# Patient Record
Sex: Female | Born: 1980 | Race: White | Hispanic: No | Marital: Married
Health system: Midwestern US, Community
[De-identification: ages and names within clinical notes are randomized; demographics above are authoritative.]

## PROBLEM LIST (undated history)

## (undated) DIAGNOSIS — Z0000 Encounter for general adult medical examination without abnormal findings: Secondary | ICD-10-CM

## (undated) DIAGNOSIS — F419 Anxiety disorder, unspecified: Secondary | ICD-10-CM

## (undated) DIAGNOSIS — E538 Deficiency of other specified B group vitamins: Secondary | ICD-10-CM

## (undated) DIAGNOSIS — R7301 Impaired fasting glucose: Secondary | ICD-10-CM

## (undated) DIAGNOSIS — E559 Vitamin D deficiency, unspecified: Secondary | ICD-10-CM

## (undated) DIAGNOSIS — F3341 Major depressive disorder, recurrent, in partial remission: Secondary | ICD-10-CM

## (undated) DIAGNOSIS — F341 Dysthymic disorder: Secondary | ICD-10-CM

## (undated) DIAGNOSIS — R590 Localized enlarged lymph nodes: Secondary | ICD-10-CM

## (undated) MED ORDER — DULOXETINE 30 MG CAP, DELAYED RELEASE
30 mg | ORAL_CAPSULE | ORAL | Status: DC
Start: ? — End: 2013-07-28

---

## 2011-01-20 LAB — BLOOD TYPE, (ABO+RH)
ABO/Rh(D): A POS
ABO/Rh: A POS

## 2013-01-07 LAB — HM PAP SMEAR

## 2013-05-01 NOTE — Progress Notes (Signed)
MARRIED - STARTING A FAMILY.  HAD VARICELLA AS A CHILD - BUT NO EVIDENCE OF IMMUNITY NOW.  GYN RECOMMENDING VACCINATION - SHE INTENDS TO HAVE THE SERIES OF TWO.    VARICELLA VACCINE #1 GIVEN TODAY.

## 2013-07-07 NOTE — Patient Instructions (Signed)
Chickenpox Vaccine: What You Need to Know    Many Vaccine Information Statements are available in Spanish and other languages. See AbsolutelyGenuine.com.br.    1. Why get vaccinated?    Chickenpox (also called varicella) is a common childhood disease. It is usually mild, but it can be serious, especially in young infants and adults.    ??? It causes a rash, itching, fever, and tiredness.  ??? It can lead to severe skin infection, scars, pneumonia, brain damage, or death.  ??? The chickenpox virus can be spread from person to person through the air, or by contact with fluid from chickenpox blisters.  ??? A person who has had chickenpox can get a painful rash called shingles years later.  ??? Before the vaccine, about 11,000 people were hospitalized for chickenpox each year in the [place].  ??? Before the vaccine, about 100 people died each year as a result of chickenpox in the [place].    Chickenpox vaccine can prevent chickenpox.    Most people who get chickenpox vaccine will not get chickenpox.  But if someone who has been vaccinated does get chickenpox, it is usually very mild.  They will have fewer blisters, are less likely to have a fever, and will recover faster.      2. Who should get chickenpox vaccine and when?    Routine  Children who have never had chickenpox should get 2doses of chickenpox vaccine at these ages:    1st Dose: 44-33 months of age  37nd Dose: 63-108 years of age (may be given earlier, if at least 3 months after the 1st dose)    People 30 years of age and older (who have never had chickenpox or received chickenpox vaccine) should get two doses at least 28 days apart.    Catch-Up  Anyone who is not fully vaccinated, and never had chickenpox, should receive one or two doses of chickenpox vaccine.  The timing of these doses depends on the person???s age.  Ask your provider.      Chickenpox vaccine may be given at the same time as other vaccines.    Note: A ???combination??? vaccine called MMRV, which  contains both chickenpox and MMR and vaccines, may be given instead of the two individual vaccines to people 95 years of age and younger.  3. Some people should not get chickenpox vaccine or should wait.    ??? People should not get chickenpox vaccine if they have ever had a life-threatening allergic reaction to a previous dose of chickenpox vaccine or to gelatin or the antibiotic neomycin.    ??? People who are moderately or severely ill at the time the shot is scheduled should usually wait until they recover before getting chickenpox vaccine.    ??? Pregnant women should wait to get chickenpox vaccine until after they have given birth. Women should not get pregnant for 1 month after getting chickenpox vaccine.     ??? Some people should check with their doctor about whether they should get chickenpox vaccine, including anyone who:   -  Has HIV/AIDS or another disease that affects the immune system   -  Is being treated with drugs that affect the immune system, such as steroids, for 2 weeks or longer   - Has any kind of cancer   - Is getting cancer treatment with radiation or drugs    ??? People who recently had a transfusion or were given other blood products should ask their doctor when they may  get chickenpox vaccine.    Ask your provider for more information.      4. What are the risks from chickenpox vaccine?    A vaccine, like any medicine, is capable of causing serious problems, such as severe allergic reactions. The risk of chickenpox vaccine causing serious harm, or death, is extremely small.    Getting chickenpox vaccine is much safer than getting chickenpox disease.  Most people who get chickenpox vaccine do not have any problems with it. Reactions are usually more likely after the first dose than after the second.     Mild Problems  ??? Soreness or swelling where the shot was given (about 1 out of 5 children and up to 1 out of 3 adolescents and adults)  ??? Fever (1 person out of 10, or less)  ??? Mild rash, up to a  month after vaccination (1 person out of 25). It is possible for these people to infect other members of their household, but this is extremely rare.    Moderate Problems  ??? Seizure (jerking or staring) caused by fever (very rare).    Severe Problems  ??? Pneumonia (very rare)  Other serious problems, including severe brain reactions and low blood count, have been reported after chickenpox vaccination. These happen so rarely experts cannot tell whether they are caused by the vaccine or not. If they are, it is extremely rare.    Note: The first dose of MMRV vaccine has been associated with rash and higher rates of fever than MMR and varicella vaccines given separately. Rash has been reported in about 1 person in 20 and fever in about 1 person in 5.   Seizures caused by a fever are also reported more often after MMRV. These usually occur 5-12 days after the first dose.       5. What if there is a moderate or severe reaction?    What should I look for?  Any unusual condition, such as a high fever, weakness,  or behavior changes. Signs of a severe allergic reaction can include difficulty breathing, hoarseness or wheezing, hives, paleness, weakness, a fast heart beat or dizziness.    What should I do?   ??? Call a doctor, or get the person to a doctor right away.   ??? Tell the doctor what happened, the date and time it happened, and when the vaccination was given.   ??? Ask your provider to report the reaction by filing a Vaccine Adverse Event Reporting System  VAERS) form. Or you can file this report through the VAERS website at www.vaers.SamedayNews.es, or by calling (305)880-4356.     VAERS does not provide medical advice.      6. The National Vaccine Injury Compensation Program    A federal program has been created to help people who may have been harmed by a vaccine.     For details about the National Vaccine Injury Compensation Program, call 626 695 8144 or visit their website at GoldCloset.com.ee.      7. How  can I learn more?    ??? Ask your provider. They can give you the vaccine package insert or suggest other sources of    information.  ??? Call your local or state health department.  ??? Contact the Centers for Disease Control and Prevention (CDC):   - Call 609-211-9722 (1-800-CDC-INFO)   - Visit CDC???s website at http://hunter.com/    Vaccine Information Statement (Interim)  Varicella Vaccine  (11/22/06)   42 U[place] ?? 829HB-71  Department of Health and [employer] for Disease Control and Prevention

## 2013-07-07 NOTE — Progress Notes (Signed)
Jennifer Campos is a 32 y.o. female who presents for routine immunizations.   She denies any symptoms , reactions or allergies that would exclude them from being immunized today.  Risks and adverse reactions were discussed and the VIS was given to them. All questions were addressed.  She was observed for 10 min post injection. There were no reactions observed.  She was given 2nd dose of varicella vaccination today.    Pt also mentions that she stopped taking her cymbalta in Sept 2014.  Pt states that she feels better and that with diet and exercise she lost approx 15lbs.  I explained to the pt the risks of stopping cymbalta abruptly without tapering (i.e. Seizures, depression, etc).  Pt agrees to call the office next time if she has questions regarding discontinuation of medications.    Jennifer Durand, MD

## 2013-07-28 NOTE — Progress Notes (Signed)
Chief Complaint:  32 y.o. WHITE OR CAUCASIAN female presents for medication managemnet  HPI:  Discontinued Cymbalta 05/29/13 after weaning herself off by taking every 2nd day, then every 3rd.... Feels good since she started running and doing yoga, and feels she no longer needs xanax or Cymbalta. Her Yoga helps her deal with anxiety, and no longer has pain so she is not taking Motrin.   Not yet trying to get pregnant , but not using birth control. Has gyn appt w/ Dr Kathi Ludwig next week.     ROS:  A comprehensive review of systems was negative except for: as above.       Allergies   Allergen Reactions   ??? Macrobid [Nitrofurantoin Monohyd/M-Cryst] Shortness of Breath     Current Outpatient Prescriptions   Medication Sig Dispense Refill   ??? PRENAT VIT COMB.10/IRON/FA/DHA (CAVAN-FOLATE DHA PO) Take  by mouth.       ??? cholecalciferol, vitamin D3, (VITAMIN D3) 2,000 unit tab Take  by mouth.       ??? omega-3 fatty acids-vitamin e (FISH OIL) 1,000 mg cap Take 1 Cap by mouth.       ??? cyanocobalamin 1,000 mcg tablet Take 1,000 mcg by mouth daily.         Past Medical History   Diagnosis Date   ??? Multiple thyroid nodules 01/14/2013     Stable on sono 11/09/11   ??? Chronic pain 01/14/2013     R rib, T11 area   ??? Seasonal allergic rhinitis 01/14/2013   ??? B12 deficiency 01/14/2013   ??? Vitamin D deficiency 01/14/2013     History reviewed. No pertinent past surgical history.  History reviewed. No pertinent family history.  History     Social History   ??? Marital Status: MARRIED     Spouse Name: fiance Amoy Steeves     Number of Children: 0   ??? 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 Smoking status: Never Smoker    ??? Smokeless tobacco: Never Used   ??? Alcohol Use: Yes      Comment: 1 drink/week   ??? Drug Use: No   ??? Sexually Active: Yes -- Female partner(s)     Other Topics Concern   ??? Not on file     Social History Narrative   ??? No narrative on file           BP 100/70   Pulse 66   Ht 5'  5" (1.651 m)   Wt 145 lb (65.772 kg)   BMI 24.13 kg/m2   SpO2 98%    PHYSICAL EXAM:  General appearance  [x] WDWN  [x] NAD      Eyes  [x] Perrl  [x] EOMI  [x] Conjunct and lids     ENT  [x] Ext NL  [] Oral Cav NL  [x] No hearing loss  [] Intern NL  [x] Lips teeth gums NL   Neck  [x] NL Appearance  [x] Thyroid NL      Respiratory  [x] BS WNL  [] Chest percussion WNL  [x] NL resp excursion  [] Chest palpation WNL    CVS  [x] RRR  [] No Bruits  [] No edema/varicos  [] No murmur  [] Fem/Carotid pulse WNL     [] 2+Bilat DP and PT pulses  [] NL palpation      Chest/Breast  [] Gen apppear WNL  [] No masses or discharge      GI  [] No HSM  [] No rectal mass  [  x]Soft, Non-tender  [] No hernia    GU-Female  [] NL ext genitalia  [] Prostate WNL  [] As per Uro     GU- Female  [] NL ext genitalia and vagina  [] External exam NL  [x] as per GYN     Lymph  [x] Neg neck nodes   [] Neg groin nodes  [x] Neg supraclav nodes  [] Neg axillae nodes    Skin  [x] No rash, lesions, or ulcers  [x] Warm and dry      Neuro  [x] Reflexes WNL  [x] Sensory WNL   [x] CN 2-12 WNL     Psych  [x] Alert  [x] Oriented x 3  [x] Memory intact     Mus  [x]  Power/strength 5/5 bilat  [x] Joints WNL  [x] No scoliosis  [x] Gait WNL  [x] No spinal tenderness     [x] No rigidity tremor  [x] No kyphosis  [x] No C/C/E        Results for orders placed in visit on 01/14/13   [facility] PAP SMEAR       Result Value Range    Amb. Pap Smear [facility] - Dr Kathi Ludwig         Health Maintenance   Topic Date Due   ??? Influenza Age 60 To Adult  04/11/2014   ??? Pap Aka Cervical Cytology  01/08/2016   ??? Td Q 10 Yrs Age > 18  01/15/2023   ??? Tdap Age > 18  Completed       Assessment/Plan:  Jennifer Campos was seen today for medication evaluation.    Diagnoses and associated orders for this visit:    Need for prophylactic vaccination and inoculation against influenza  - INFLUENZA VIRUS VACCINE, SPLIT, PRES. FREE, IN INDIVIDS. >=3 YRS OF AGE, IM          Medications Discontinued During This Encounter   Medication Reason   ??? tretinoin (RETIN-A) 0.01 % topical gel  Not A Current Medication   ??? ibuprofen (MOTRIN) 800 mg tablet Not A Current Medication   ??? GLYCOLIC ACID Not A Current Medication   ??? ALPRAZolam (XANAX) 0.25 mg tablet Other   ??? duloxetine (CYMBALTA) 30 mg capsule Other             Total time spent with patient:  30 minutes   [x]   >50% of visit spent in counseling and coordination of care on above issues     Care Plan discussed with:   [x] Patient  [] Family  [] Care Manager  [] Nursing  [] Consultant/Specialist              Discussed:   [] Advanced Directives   [x]  diet  [x] excercise   [] seat belts    []  smoking cessation  [] safe sex practices   [] sun screen usage  [] weight loss recommended    [] referred to diabetes educator   [] carbohydrate counting reviewed   [] blood sugar goals reviewed  [] complications of diabetes reviewed    []   []          Jennifer Faulstich, MD   __________________________________________________

## 2013-07-28 NOTE — Patient Instructions (Addendum)
Vaccine Information Statement    Influenza (Flu) Vaccine (Inactivated or Recombinant): What you need to know  2014-15    Many Vaccine Information Statements are available in Spanish and other languages. See www.immunize.org/vis  Hojas de Informaci??n Sobre Vacunas est??n disponibles en Espa??ol y en muchos otros idiomas. Visite http://www.immunize.org/vis    1. Why get vaccinated?    Influenza (???flu???) is a contagious disease that spreads around the [place] every winter, usually between October and May.     Flu is caused by influenza viruses, and is spread mainly by coughing, sneezing, and close contact.     Anyone can get flu, but the risk of getting flu is highest among children. Symptoms come on suddenly and may last several days. They can include:  ??? fever/chills  ??? sore throat  ??? muscle aches  ??? fatigue  ??? cough  ??? headache   ??? runny or stuffy nose    Flu can make some people much sicker than others. These people include young children, people 65 and older, pregnant women, and people with certain health conditions ??? such as heart, lung or kidney disease, nervous system disorders, or a weakened immune system.  Flu vaccination is especially important for these people, and anyone in close contact with them.    Flu can also lead to pneumonia, and make existing medical conditions worse. It can cause diarrhea and seizures in children.     Each year thousands of people in the [place] die from flu, and many more are hospitalized.     Flu vaccine is the best protection against flu and its complications. Flu vaccine also helps prevent spreading flu from person to person.       2. Inactivated and recombinant flu vaccines    You are getting an injectable flu vaccine, which is either an ???inactivated??? or ???recombinant??? vaccine. These vaccines do not contain any live influenza virus.  They are given by injection with a needle, and often called the ???flu shot.???    A different, live, attenuated (weakened) influenza  vaccine is sprayed into the nostrils. This vaccine is described in a separate Vaccine Information Statement.    Flu vaccination is recommended every year. Some children 6 months through 8 years of age might need two doses during one year.     Flu viruses are always changing. Each year???s flu vaccine is made to protect against 3 or 4 viruses that are likely to cause disease that year. Flu vaccine cannot prevent all cases of flu, but it is the best defense against the disease.     It takes about 2 weeks for protection to develop after the vaccination, and protection lasts several months to a year.     Some illnesses that are not caused by influenza virus are often mistaken for flu. Flu vaccine will not prevent these illnesses. It can only prevent influenza.    Some inactivated flu vaccine contains a very small amount of a mercury-based preservative called thimerosal. Studies have shown that thimerosal in vaccines is not harmful, but flu vaccines that do not contain a preservative are available.      3. Some people should not get this vaccine    Tell the person who gives you the vaccine:  ??? If you have any severe, life-threatening allergies.  If you ever had a life-threatening allergic reaction after a dose of flu vaccine, or have a severe allergy to any part of this vaccine, including (for example) an allergy to gelatin,   antibiotics, or eggs, you may be advised not to get vaccinated.  Most, but not all, types of flu vaccine contain a small amount of egg protein.     ??? If you ever had Guillain-Barr?? Syndrome (a severe paralyzing illness, also called GBS). Some people with a history of GBS should not get this vaccine. This should be discussed with your doctor.  ??? If you are not feeling well.  It is usually okay to get flu vaccine when you have a mild illness, but you might be advised to wait until you feel better.  You should come back when you are better.      4. Risks of a vaccine reaction    With a vaccine, like any  medicine, there is a chance of side effects. These are usually mild and go away on their own.             Problems that could happen after any vaccine:   ??? Brief fainting spells can happen after any medical procedure, including vaccination. Sitting or lying down for about 15 minutes can help prevent fainting, and injuries caused by a fall. Tell your doctor if you feel dizzy, or have vision changes or ringing in the ears.   ??? Severe shoulder pain and reduced range of motion in the arm where a shot was given can happen, very rarely, after a vaccination.   ??? Severe allergic reactions from a vaccine are very rare, estimated at less than 1 in a million doses. If one were to occur, it would usually be within a few minutes to a few hours after the vaccination.     Mild problems following inactivated flu vaccine:   ??? soreness, redness, or swelling where the shot was given    ??? hoarseness  ??? sore, red or itchy eyes  ??? cough  ??? fever  ??? aches  ??? headache  ??? itching  ??? fatigue  If these problems occur, they usually begin soon after the shot and last 1 or 2 days.     Moderate problems following inactivated flu vaccine:  ??? Young children who get inactivated flu vaccine and pneumococcal vaccine (PCV13) at the same time may be at increased risk for seizures caused by fever. Ask your doctor for more information. Tell your doctor if a child who is getting flu vaccine has ever had a seizure.    Inactivated flu vaccine does not contain live flu virus, so you cannot get the flu from this vaccine.     As with any medicine, there is a very remote chance of a vaccine causing a serious injury or death.    The safety of vaccines is always being monitored. For more information, visit: www.cdc.gov/vaccinesafety/    5. What if there is a serious reaction?    What should I look for?  ??? Look for anything that concerns you, such as signs of a severe allergic reaction, very high  fever, or behavior changes.     Signs of a severe allergic reaction  can include hives, swelling of the face and throat, difficulty   breathing, a fast heartbeat, dizziness, and weakness. These would usually start a few minutes to a few hours after the vaccination.    What should I do?  ??? If you think it is a severe allergic reaction or other emergency that can???t wait, call 9-1-1 and get the person to the nearest hospital. Otherwise, call your doctor.    ??? Afterward, the reaction should   be reported to the ???Vaccine Adverse Event Reporting    System??? (VAERS). Your doctor should file this report, or you can do it yourself through    the VAERS web site at www.vaers.LAgents.no, or by calling 1-5751705233.    VAERS does not give medical advice.      6. The National Vaccine Injury Compensation Program    The [employer] Vaccine Injury Compensation Program (VICP) is a federal program that was created to compensate people who may have been injured by certain vaccines.    Persons who believe they may have been injured by a vaccine can learn about the program and about filing a claim by calling 1-(305)782-5996 or visiting the VICP website at SpiritualWord.at.  There is a time limit to file a claim for compensation.    7. How can I learn more?  ??? Ask your health care provider.  ??? Call your local or state health department.  ??? Contact the Centers for Disease Control and   Prevention (CDC):  - Call 316-542-3301 (1-800-CDC-INFO) or  - Visit CDC???s website at BiotechRoom.com.cy      Vaccine Information Statement (Interim)  Inactivated Influenza Vaccine   04/29/2013  42 [place] ?? [id number]-[id number]    Department of Health and Insurance risk surveyor for Disease Control and Prevention    Office Use Only    Influenza (Flu) Vaccine: After Your Visit  Your Care Instructions  Influenza (flu) is an infection in the lungs and breathing passages. It is caused by the influenza virus. There are different strains, or types, of the flu virus every year. The flu comes on quickly. It can cause a cough, stuffy nose,  fever, chills, tiredness, and aches and pains. These symptoms may last up to 10 days. The flu can make you feel very sick, but most of the time it doesn't lead to other problems. But it can cause serious problems in people who are older or who have a long-term illness, such as heart disease or diabetes.  You can help prevent the flu by getting a flu vaccine every year, as soon as it is available. It is given as a shot or in a nasal spray. The viruses in the flu shot are dead, and the nasal spray (FluMist) has weakened live viruses. You cannot get the flu from the shot or the spray. FluMist can be given to healthy people from ages 2 through 37. FluMist is not approved for pregnant women. The vaccine prevents most cases of the flu. But even when the vaccine doesn't prevent the flu, it can make symptoms less severe and reduce the chance of problems from the flu.  Sometimes, young children and people who have an immune system problem may have a slight fever or muscle aches or pains 6 to 12 hours after getting the shot. These symptoms usually last 1 or 2 days.  Follow-up care is a key part of your treatment and safety. Be sure to make and go to all appointments, and call your doctor if you are having problems. It's also a good idea to know your test results and keep a list of the medicines you take.  Who should get the flu vaccine?  Everyone age 86 months or older should get a flu vaccine each year. It lowers the chance of getting and spreading the flu. The vaccine is very important for people who are at high risk for getting other health problems from the flu. This includes:  ?? Anyone 33 years of age or  older.  ?? People who live in a long-term care center, such as a nursing home.  ?? All children 6 months through 34 years of age.  ?? Adults and children 6 months and older who have long-term heart or lung problems, such as asthma.  ?? Adults and children 6 months and older who needed medical care or were in a hospital during  the past year because of diabetes, chronic kidney disease, or a weak immune system (including HIV or AIDS).  ?? Women who will be pregnant during the flu season.  ?? People who have any condition that can make it hard to breathe or swallow (such as a brain injury or muscle disorders).  ?? People who can give the flu to others who are at high risk for problems from the flu. This includes all health care workers and close contacts of people age 34 or older.  Who should not get the flu vaccine?  The person who gives the vaccine may tell you not to get it if you:  ?? Have a severe allergy to eggs or any part of the vaccine.  ?? Have had a severe reaction to a flu vaccine in the past.  ?? Have had Guillain-Barr?? syndrome (GBS).  ?? Are sick with a fever. (Get the vaccine when symptoms are gone.)  How can you care for yourself at home?  ?? If you or your child has a sore arm or a slight fever after the shot, take an over-the-counter pain medicine, such as acetaminophen (Tylenol) or ibuprofen (Advil, Motrin). Read and follow all instructions on the label. Do not give aspirin to anyone younger than 20. It has been linked to Reye syndrome, a serious illness.  ?? Do not take two or more pain medicines at the same time unless the doctor told you to. Many pain medicines have acetaminophen, which is Tylenol. Too much acetaminophen (Tylenol) can be harmful.  When should you call for help?  Call 911 anytime you think you may need emergency care. For example, call if after getting the flu vaccine:  ?? You have symptoms of a severe reaction to the flu vaccine. Symptoms of a severe reaction may include:  ?? Severe difficulty breathing.  ?? Sudden raised, red areas (hives) all over your body.  ?? Severe lightheadedness.  Call your doctor now or seek immediate medical care if after getting the flu vaccine:  ?? You think you are having a reaction to the flu vaccine, such as a new fever.  Watch closely for changes in your health, and be sure to  contact your doctor if you have any problems.   Where can you learn more?   Go to [email or web address]  Enter N880 in the search box to learn more about "Influenza (Flu) Vaccine: After Your Visit."   ?? 2006-2014 Healthwise[employer] Inc[employer]. Care instructions adapted under license by [employer] (which disclaims liability or warranty for this information). This care instruction is for use with your licensed healthcare professional. If you have questions about a medical condition or this instruction, always ask your healthcare professional. Healthwise, Incorporated disclaims any warranty or liability for your use of this information.  Content Version: 10.2.346038; Current as of: November 20, 2012

## 2013-07-30 ENCOUNTER — Encounter

## 2013-11-18 ENCOUNTER — Encounter

## 2014-01-12 LAB — AMB POC RAPID STREP A: Group A Strep Ag: NEGATIVE

## 2014-01-12 MED ORDER — BENZONATATE 200 MG CAP
200 mg | ORAL_CAPSULE | Freq: Three times a day (TID) | ORAL | Status: AC | PRN
Start: 2014-01-12 — End: 2014-01-19

## 2014-01-12 MED ORDER — PREDNISONE 10 MG TAB
10 mg | ORAL_TABLET | ORAL | Status: DC
Start: 2014-01-12 — End: 2014-01-30

## 2014-01-12 MED ORDER — LORATADINE 10 MG TAB
10 mg | ORAL_TABLET | Freq: Every day | ORAL | Status: AC
Start: 2014-01-12 — End: ?

## 2014-01-12 NOTE — Progress Notes (Signed)
Chief Complaint:  33 y.o. WHITE OR CAUCASIAN female cough, nasal and head congestion, sore throat  HPI:  Had flu vaccine 07/28/13.  Got sik in December - nasal and head congestion, sore throat - took nose spray, nasal decongestant. Went to clinic at work and treated with Augmentin. Reports she had a persistent deep cough. At the end of February again got sick, pain on swallowing, head congestion, laryngitis - Again treated w/ Augmentin, phenylepherine tabs, robitussin. Throat felt better and nasal congestion improved, but cough again persisted. On 12/31/13 again developed sore throat - went to clinic - not given any antibiotics. Reports nasal congestion again, cough productive green sputum, sore throat, pain on swallowing. Has pain in ears when flying.    ROS: Denies CP, SOB, abdominal pain, dysuria, HA, fever, chills, rash  A comprehensive review of systems was negative except as above      Allergies   Allergen Reactions   ??? Macrobid [Nitrofurantoin Monohyd/M-Cryst] Shortness of Breath     Current Outpatient Prescriptions   Medication Sig Dispense Refill   ??? krill-om-3-dha-epa-phospho-ast 1,000-170-80-50 mg cap Take  by mouth.       ??? glucosamine (GLUCOSAMINE RELIEF) 1,000 mg tab Take  by mouth.       ??? cyanocobalamin (VITAMIN B-12) 500 mcg tablet Take 500 mcg by mouth daily.       ??? predniSONE (DELTASONE) 10 mg tablet 6 tablets today, then 5 tab x 1 day, 4 tab x 1 day, 3 tab x 1 day, 2 tab x 1 day, 1 tab x 1 day  21 Tab  0   ??? benzonatate (TESSALON) 200 mg capsule Take 1 Cap by mouth three (3) times daily as needed for Cough for up to 7 days.  30 Cap  0   ??? loratadine (CLARITIN) 10 mg tablet Take 1 Tab by mouth daily.  30 Tab  0   ??? PRENAT VIT COMB.10/IRON/FA/DHA (CAVAN-FOLATE DHA PO) Take  by mouth.       ??? cholecalciferol, vitamin D3, (VITAMIN D3) 2,000 unit tab Take  by mouth.       ??? omega-3 fatty acids-vitamin e (FISH OIL) 1,000 mg cap Take 1 Cap by mouth.         Past Medical History   Diagnosis Date   ???  Multiple thyroid nodules 01/14/2013     Stable on sono 11/09/11   ??? Chronic pain 01/14/2013     R rib, T11 area   ??? Seasonal allergic rhinitis 01/14/2013   ??? B12 deficiency 01/14/2013   ??? Vitamin D deficiency 01/14/2013     History reviewed. No pertinent past surgical history.  History reviewed. No pertinent family history.  History     Social History   ??? Marital Status: MARRIED     Spouse Name: fiance Jennifer Campos     Number of Children: 0   ??? 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 Smoking status: Never Smoker    ??? Smokeless tobacco: Never Used   ??? Alcohol Use: Yes      Comment: 1 drink/week   ??? Drug Use: No   ??? Sexual Activity:     Partners: Male     Other Topics Concern   ??? Not on file     Social History Narrative       No flowsheet data found.  No flowsheet data found.  BP 95/70   Pulse 77   Temp(Src) 98.9 ??F (37.2 ??C) (Oral)   Ht 5\' 5"  (1.651 m)   Wt 148 lb (67.132 kg)   BMI 24.63 kg/m2   SpO2 99%    PHYSICAL EXAM:  General appearance  [x] WDWN  [x] NAD B/l nasal congestion No posterior pharyngeal erythema    Eyes  [] Perrl  [x] EOMI  [x] Conjunct and lids     ENT  [x] Ext NL  [] Oral Cav NL  [] No hearing loss  [] Intern NL  [x] Lips teeth gums NL   Neck  [x] NL Appearance  [x] Thyroid NL      Respiratory  [x] BS WNL  [] Chest percussion WNL  [x] NL resp excursion  [] Chest palpation WNL    CVS  [x] RRR  [] No Bruits  [] No edema/varicos  [x] No murmur  [] Fem/Carotid pulse WNL     [] 2+Bilat DP and PT pulses  [] NL palpation      Chest/Breast  [] Gen apppear WNL  [] No masses or discharge      GI  [] No HSM  [] No rectal mass  [] Soft, Non-tender  [] No hernia    GU-Female  [] NL ext genitalia  [] Prostate WNL  [] As per Uro     GU- Female  [] NL ext genitalia and vagina  [] External exam NL  [x] as per GYN     Lymph  [x] Neg neck nodes   [] Neg groin nodes  [x] Neg supraclav nodes  [] Neg axillae nodes    Skin  [x] No rash, lesions, or ulcers  [x] Warm and dry      Neuro  [x] Reflexes WNL   [] Sensory WNL   [] CN 2-12 WNL     Psych  [x] Alert  [x] Oriented x 3  [x] Memory intact     Mus  [x]  Power/strength 5/5 bilat  [x] Joints WNL  [x] No scoliosis  [x] Gait WNL  [] No spinal tenderness     [x] No rigidity tremor  [x] No kyphosis  [x] No C/C/E        Results for orders placed in visit on 01/12/14   AMB POC RAPID STREP A       Result Value Ref Range    VALID INTERNAL CONTROL POC No      Group A Strep Ag Negative  Negative       Results for orders placed in visit on 01/12/14   AMB POC RAPID STREP A       Result Value Ref Range    VALID INTERNAL CONTROL POC No      Group A Strep Ag Negative  Negative       Health Maintenance   Topic Date Due   ??? INFLUENZA AGE 61 TO ADULT  04/11/2014   ??? PAP AKA CERVICAL CYTOLOGY  01/08/2016   ??? Td Q 10 Yrs Age > 18  01/15/2023   ??? TDAP AGE > 18  Completed       Assessment/Plan:  Jennifer Campos was seen today for cough, nasal congestion and sore throat.    Diagnoses and associated orders for this visit:    Cough  - predniSONE (DELTASONE) 10 mg tablet; 6 tablets today, then 5 tab x 1 day, 4 tab x 1 day, 3 tab x 1 day, 2 tab x 1 day, 1 tab x 1 day  - benzonatate (TESSALON) 200 mg capsule; Take 1 Cap by mouth three (3) times daily as needed for Cough for up to 7 days.  - loratadine (CLARITIN) 10 mg tablet; Take 1 Tab by mouth daily.    Sore throat  - AMB POC RAPID STREP  A    Nasal congestion  - predniSONE (DELTASONE) 10 mg tablet; 6 tablets today, then 5 tab x 1 day, 4 tab x 1 day, 3 tab x 1 day, 2 tab x 1 day, 1 tab x 1 day  - loratadine (CLARITIN) 10 mg tablet; Take 1 Tab by mouth daily.    Post-nasal drip  - predniSONE (DELTASONE) 10 mg tablet; 6 tablets today, then 5 tab x 1 day, 4 tab x 1 day, 3 tab x 1 day, 2 tab x 1 day, 1 tab x 1 day  - loratadine (CLARITIN) 10 mg tablet; Take 1 Tab by mouth daily.          Medications Discontinued During This Encounter   Medication Reason   ??? cyanocobalamin 1,000 mcg tablet Not A Current Medication       Total time spent with patient:  30 minutes   []    >50% of visit spent in counseling and coordination of care on above issues     Care Plan discussed with:   [x] Patient  [] Family  [] Care Manager  [] Nursing  [] Consultant/Specialist              Discussed:   [] Advanced Directives   []  diet  [] excercise   [] seat belts    []  smoking cessation  [] safe sex practices   [] sun screen usage  [] weight loss recommended    [] referred to diabetes educator   [] carbohydrate counting reviewed   [] blood sugar goals reviewed  [] complications of diabetes reviewed    []   []          Dolly Harbach, MD   __________________________________________________

## 2014-01-12 NOTE — Patient Instructions (Signed)
Managing Your Allergies: After Your Visit  Your Care Instructions  Managing your allergies is an important part of staying healthy. Your doctor will help you find out what may be causing the allergies. Common causes of allergy symptoms are house dust and dust mites, animal dander, mold, and pollen.  As soon as you know what triggers your symptoms, try to reduce your exposure to your triggers. This can help prevent allergy symptoms, asthma, and other health problems.  Ask your doctor about allergy medicine or immunotherapy. These treatments may help reduce or prevent allergy symptoms.  Follow-up care is a key part of your treatment and safety. Be sure to make and go to all appointments, and call your doctor if you are having problems. It's also a good idea to know your test results and keep a list of the medicines you take.  How can you care for yourself at home?  ?? If you think that dust or dust mites are causing your allergies:  ?? Wash sheets, pillowcases, and other bedding every week in hot water.  ?? Use airtight, dust-proof covers for pillows, duvets, and mattresses. Avoid plastic covers, because they tend to tear quickly and do not "breathe." Wash according to the instructions.  ?? Remove extra blankets and pillows that you don't need.  ?? Use blankets that are machine-washable.  ?? Don't use home humidifiers. They can help mites live longer.  ?? Use air-conditioning. Change or clean all filters every month. Keep windows closed. Use high-efficiency air filters. Don't use window or attic fans, which draw dust into the air.  ?? If you're allergic to pet dander, keep pets outside or, at the very least, out of your bedroom. Old carpet and cloth-covered furniture can hold a lot of animal dander. You may need to replace them.  ?? Look for signs of cockroaches. Use cockroach baits to get rid of them. Then clean your home well.  ?? If you're allergic to mold, don't keep indoor plants, because molds can grow in soil. Get rid  of furniture, rugs, and drapes that smell musty. Check for mold in the bathroom.  ?? If you're allergic to pollen, stay inside when pollen counts are high.  ?? Don't smoke or let anyone else smoke in your house. Don't use fireplaces or wood-burning stoves. Avoid paint fumes, perfumes, and other strong odors.  When should you call for help?  Call 911 anytime you think you may need emergency care. For example, call if:  ?? You have symptoms of a severe allergic reaction. These may include:  ?? Sudden raised, red areas (hives) all over your body.  ?? Swelling of the throat, mouth, lips, or tongue.  ?? Trouble breathing.  ?? Passing out (losing consciousness). Or you may feel very lightheaded or suddenly feel weak, confused, or restless.  Watch closely for changes in your health, and be sure to contact your doctor if:  ?? Your allergies get worse.  ?? You need help controlling your allergies.  ?? You have questions about allergy testing.  ?? You do not get better as expected.   Where can you learn more?   Go to http://www.healthwise.net/[facility]  Enter L249 in the search box to learn more about "Managing Your Allergies: After Your Visit."   ?? 2006-2015 Healthwise, Incorporated. Care instructions adapted under license by [employer] (which disclaims liability or warranty for this information). This care instruction is for use with your licensed healthcare professional. If you have questions about a medical condition or   this instruction, always ask your healthcare professional. Healthwise, Incorporated disclaims any warranty or liability for your use of this information.  Content Version: 10.4.390249; Current as of: Jan 21, 2013              Sore Throat: After Your Visit  Your Care Instructions     Infection by bacteria or a virus causes most sore throats. Cigarette smoke, dry air, air pollution, allergies, and yelling can also cause a sore throat. Sore throats can be painful and annoying. Fortunately, most sore throats go away on  their own. If you have a bacterial infection, your doctor may prescribe antibiotics.  Follow-up care is a key part of your treatment and safety. Be sure to make and go to all appointments, and call your doctor if you are having problems. It's also a good idea to know your test results and keep a list of the medicines you take.  How can you care for yourself at home?  ?? If your doctor prescribed antibiotics, take them as directed. Do not stop taking them just because you feel better. You need to take the full course of antibiotics.  ?? Gargle with warm salt water once an hour to help reduce swelling and relieve discomfort. Use 1 teaspoon of salt mixed in 1 cup of warm water.  ?? Take an over-the-counter pain medicine, such as acetaminophen (Tylenol), ibuprofen (Advil, Motrin), or naproxen (Aleve). Read and follow all instructions on the label.  ?? Be careful when taking over-the-counter cold or flu medicines and Tylenol at the same time. Many of these medicines have acetaminophen, which is Tylenol. Read the labels to make sure that you are not taking more than the recommended dose. Too much acetaminophen (Tylenol) can be harmful.  ?? Drink plenty of fluids. Fluids may help soothe an irritated throat. Hot fluids, such as tea or soup, may help decrease throat pain.  ?? Use over-the-counter throat lozenges to soothe pain. Regular cough drops or hard candy may also help. These should not be given to young children because of the risk of choking.  ?? Do not smoke or allow others to smoke around you. If you need help quitting, talk to your doctor about stop-smoking programs and medicines. These can increase your chances of quitting for good.  ?? Use a vaporizer or humidifier to add moisture to your bedroom. Follow the directions for cleaning the machine.  When should you call for help?  Call your doctor now or seek immediate medical care if:  ?? You have new or worse trouble swallowing.  ?? Your sore throat gets much worse on one  side.  Watch closely for changes in your health, and be sure to contact your doctor if you do not get better as expected.   Where can you learn more?   Go to [email or web address]://www.healthwise.net/[facility]  Enter U420 in the search box to learn more about "Sore Throat: After Your Visit."   ?? 2006-2015 Healthwise, Incorporated. Care instructions adapted under license by [employer] (which disclaims liability or warranty for this information). This care instruction is for use with your licensed healthcare professional. If you have questions about a medical condition or this instruction, always ask your healthcare professional. Healthwise, Incorporated disclaims any warranty or liability for your use of this information.  Content Version: 10.4.390249; Current as of: July 25, 2013

## 2014-01-22 LAB — VITAMIN B12: Vitamin B12: 836 pg/mL (ref 211–911)

## 2014-01-22 LAB — CBC WITH AUTOMATED DIFF
ABS. BASOPHILS: 0 10*3/uL (ref <0.3)
ABS. EOSINOPHILS: 0.1 10*3/uL (ref <0.7)
ABS. MONOCYTES: 0.3 10*3/uL (ref <1.0)
ABS. NEUTROPHILS: 3.2 10*3/uL (ref 1.6–7.8)
Abs Lymphocytes: 1.7 10*3/uL (ref 1.0–4.5)
BASOPHILS: 0.4 % (ref 0.0–2.0)
EOSINOPHILS: 1.5 % (ref 0.0–7.0)
HCT: 43.1 % (ref 34.0–47.0)
HGB: 14.3 g/dL (ref 11.0–15.5)
Lymphocytes: 31.7 % (ref 15.0–45.0)
MCH: 31.9 pg (ref 25.0–33.0)
MCHC: 33.3 g/dL (ref 30.0–35.0)
MCV: 96 fL (ref 80–102)
MEAN PLATELET VOLUME: 8.9 fL (ref 6.8–13.0)
MONOCYTES: 5.2 % (ref 2.0–10.0)
NEUTROPHILS: 61.1 % (ref 40.0–70.0)
PLATELET: 229 10*3/uL (ref 150–450)
RBC: 4.49 x10E6/uL (ref 3.70–5.20)
RDW-CV: 13.2 % (ref 11.5–15.0)
WBC: 5.3 10*3/uL (ref 4.0–11.0)

## 2014-01-22 LAB — LIPID PANEL
Cholesterol, total: 197 mg/dL (ref 125–200)
HDL Chol (%): 35.7 % (ref >15)
HDL Cholesterol: 70 mg/dL (ref >=46)
LDL, calculated: 114 mg/dL (ref <130)
Non-HDL Cholesterol: 127 mg/dL
T. Chol/HDL Ratio: 2.8 Ratio (ref <5.00)
Triglyceride: 64 mg/dL (ref <150)
VLDL, calculated: 13 (ref 8–35)

## 2014-01-22 LAB — METABOLIC PANEL, COMPREHENSIVE
A-G Ratio: 2 Ratio (ref 0.9–2.4)
ALT (SGPT): 17 IU/L (ref 6–40)
AST (SGOT): 19 IU/L (ref 10–30)
Albumin: 5 g/dL (ref 3.6–5.3)
Alk. phosphatase: 43 IU/L (ref 33–115)
BUN/Creatinine ratio: 20 (ref 5.3–50.0)
BUN: 15 mg/dL (ref 7–25)
Bilirubin, total: 1.7 mg/dL — ABNORMAL HIGH (ref 0.2–1.2)
CO2: 26 mEq/L (ref 21–33)
Calcium: 9.6 mg/dL (ref 8.6–10.4)
Chloride: 101 mEq/L (ref 98–110)
Creatinine: 0.75 mg/dL (ref 0.50–1.30)
Estimated GFR: 88 (ref >60)
Globulin: 2.5 g/dL (ref 1.8–3.6)
Glucose: 121 mg/dL — ABNORMAL HIGH (ref 65–99)
Potassium: 4.6 mmol/L (ref 3.5–5.5)
Protein, total: 7.5 g/dL (ref 6.2–8.3)
Sodium: 136 mEq/L (ref 135–146)
eGFR If African American: 107 (ref >60)

## 2014-01-22 LAB — TSH 3RD GENERATION: TSH: 1.01 u[IU]/mL (ref 0.36–4.42)

## 2014-01-22 LAB — T4 (THYROXINE): T4, Total: 8.3 ug/dL (ref 4.5–12.2)

## 2014-01-22 LAB — T3 TOTAL: T3, total: 80 ng/dL (ref 60–181)

## 2014-01-23 LAB — URINALYSIS W/MICROSCOPIC
Bilirubin: NEGATIVE
Blood: NEGATIVE
Crystals, urine: NONE SEEN /HPF
Glucose: NEGATIVE mg/dL
Ketone: NEGATIVE mg/dL
Nitrites: NEGATIVE
Protein: NEGATIVE mg/dL
RBC Urine: NONE SEEN /HPF (ref ?–5)
Specific gravity: 1.022 R.I. (ref 1.005–1.030)
Urobilinogen: 0.2 mg/dL (ref 0.0–1.0)
Yeast, urine: NONE SEEN /HPF
pH (UA): 6.5 (ref 5.0–8.0)

## 2014-01-23 LAB — VITAMIN D, 25 HYDROXY: VITAMIN D, 25-HYDROXY: 39 ng/mL (ref 30–100)

## 2014-01-30 NOTE — Progress Notes (Signed)
Chief Complaint:  33 y.o. WHITE OR CAUCASIAN female presents for CPE  HPI:  Has been feeling well. Exercising regularly and eating healthy.  ROS: Denies CP, SOB, abdominal pain, dysuria, HA, fever, chills, rash  A comprehensive review of systems was negative except as above      Allergies   Allergen Reactions   ??? Macrobid [Nitrofurantoin Monohyd/M-Cryst] Shortness of Breath     Current Outpatient Prescriptions   Medication Sig Dispense Refill   ??? gluc-chon-msm#1-C-mang-bos-bor (OSTEO BI-FLEX TRIPLE STRENGTH) 750 mg-644 mg- 30 mg-1 mg tab Take  by mouth.       ??? cyanocobalamin (VITAMIN B-12) 500 mcg tablet Take 500 mcg by mouth daily.       ??? loratadine (CLARITIN) 10 mg tablet Take 1 Tab by mouth daily.  30 Tab  0   ??? cholecalciferol, vitamin D3, (VITAMIN D3) 2,000 unit tab Take  by mouth.       ??? omega-3 fatty acids-vitamin e (FISH OIL) 1,000 mg cap Take 1 Cap by mouth.       ??? PRENAT VIT COMB.10/IRON/FA/DHA (CAVAN-FOLATE DHA PO) Take  by mouth.         Past Medical History   Diagnosis Date   ??? Multiple thyroid nodules 01/14/2013     Stable on sono 11/09/11   ??? Chronic pain 01/14/2013     R rib, T11 area   ??? Seasonal allergic rhinitis 01/14/2013   ??? B12 deficiency 01/14/2013   ??? Vitamin D deficiency 01/14/2013     History reviewed. No pertinent past surgical history.  History reviewed. No pertinent family history.  History     Social History   ??? Marital Status: MARRIED     Spouse Name: fiance Angellina Ferdinand     Number of Children: 0   ??? 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 Smoking status: Never Smoker    ??? Smokeless tobacco: Never Used   ??? Alcohol Use: Yes      Comment: 1 drink/week   ??? Drug Use: No   ??? Sexual Activity:     Partners: Male     Other Topics Concern   ??? Not on file     Social History Narrative       PHQ 2 / 9, over the last two weeks 01/30/2014   Little interest or pleasure in doing things Not at all   Feeling down, depressed or hopeless Not at  all   Total Score PHQ 2 0     No flowsheet data found.      BP 90/65   Pulse 78   Temp(Src) 98 ??F (36.7 ??C) (Oral)   Ht 5\' 6"  (1.676 m)   Wt 142 lb (64.411 kg)   BMI 22.93 kg/m2   SpO2 100%    PHYSICAL EXAM:  General appearance  [x] WDWN  [x] NAD      Eyes  [x] Perrl  [x] EOMI  [x] Conjunct and lids     ENT  [x] Ext NL  [x] Oral Cav NL  [] No hearing loss  [] Intern NL  [x] Lips teeth gums NL   Neck  [x] NL Appearance  [x] Thyroid NL      Respiratory  [x] BS WNL  [] Chest percussion WNL  [x] NL resp excursion  [] Chest palpation WNL    CVS  [x] RRR  [] No Bruits  [] No edema/varicos  [x] No murmur  [] Fem/Carotid pulse WNL     [] 2+Bilat DP  and PT pulses  $Remov'[]'cdEdFl$ NL palpation      Chest/Breast  $RemoveBefor'[]'BMEbkTYsqNni$ Gen apppear WNL  $Re'[]'TPD$ No masses or discharge      GI  $R'[x]'kS$ No HSM  $Re'[]'OQi$ No rectal mass  $Rem'[x]'OdzC$ Soft, Non-tender  $RemoveBef'[x]'NzAwYJcCSA$ No hernia    GU-Female  $Remove'[]'CSDtlbn$ NL ext genitalia  $RemoveBe'[]'PaxfznWDU$ Prostate WNL  $Re'[]'NOv$ As per Uro     GU- Female  $Remov'[]'YXcIMD$ NL ext genitalia and vagina  $Remov'[]'vWELza$ External exam NL  $R'[x]'OR$ as per GYN     Lymph  $Remo'[x]'EfQPY$ Neg neck nodes   '[]'$ Neg groin nodes  $Remo'[x]'xJdPb$ Neg supraclav nodes  $Remo'[]'whLtT$ Neg axillae nodes    Skin  $Rem'[x]'Wyoz$ No rash, lesions, or ulcers  $Remov'[x]'qyXkfP$ Warm and dry      Neuro  $Remo'[x]'KUPHh$ Reflexes WNL  $Re'[x]'CIh$ Sensory WNL   '[x]'$ CN 2-12 WNL     Psych  $Remo'[x]'eEUWe$ Alert  $Remo'[x]'xCmPp$ Oriented x 3  $'[x]'b$ Memory intact     Mus  $Re'[x]'yiw$  Power/strength 5/5 bilat  $Remo'[x]'XCHys$ Joints WNL  $Re'[x]'cnj$ No scoliosis  $RemoveBe'[x]'OrnPSLHQM$ Gait WNL  $Re'[x]'LXJ$ No spinal tenderness     '[x]'$ No rigidity tremor  $Remov'[x]'pHBoQb$ No kyphosis  $RemoveB'[x]'opCaflQc$ No C/C/E        Results for orders placed in visit on 51/70/01   METABOLIC PANEL, COMPREHENSIVE       Result Value Ref Range    Glucose 121 (*) 65-99 mg/dL    BUN 15  7-25 mg/dL    Creatinine 0.75  0.50-1.30 mg/dL    eGFR If African American 107  >60    Estimated GFR 88  >60    BUN/Creatinine ratio 20.0  5.3-50.0    Sodium 136  135-146 mEq/L    Potassium 4.6  3.5-5.5 mmol/L    Chloride 101  98-110 mEq/L    CO2 26  21-33 mEq/L    Calcium 9.6  8.6-10.4 mg/dL    Protein, total 7.5  6.2-8.3 g/dL    Albumin 5.0  3.6-5.3 g/dL    Globulin 2.5  1.8-3.6 g/dL    A-G Ratio 2.0  0.9-2.4 Ratio     Alk. phosphatase 43  33-115 IU/L    AST 19  10-30 IU/L    ALT 17  6-40 IU/L    Bilirubin, total 1.7 (*) 0.2-1.2 mg/dL   T4       Result Value Ref Range    T4 8.3  4.5-12.2 ug/dL   TSH, 3RD GENERATION       Result Value Ref Range    TSH 1.01  0.36-4.42 uIU/mL   T3 TOTAL       Result Value Ref Range    T3, total 80  60-181 ng/dL   VITAMIN D, 25 HYDROXY       Result Value Ref Range    VITAMIN D, 25-HYDROXY 39  30-100 ng/mL   VITAMIN B12       Result Value Ref Range    Vitamin B12 836  211-911 pg/mL   CBC WITH AUTOMATED DIFF       Result Value Ref Range    WBC 5.3  4.0-11.0 x10E3/uL    RBC 4.49  3.70-5.20 x10E6/uL    HGB 14.3  11.0-15.5 g/dL    HCT 43.1  34.0-47.0 %    MCV 96  80-102 fL    MCH 31.9  25.0-33.0 pg    MCHC 33.3  30.0-35.0 g/dL    NEUTROPHILS 61.1  40.0-70.0 %    Lymphocytes 31.7  15.0-45.0 %    MONOCYTES 5.2  2.0-10.0 %    EOSINOPHILS 1.5  0.0-7.0 %    BASOPHILS 0.4  0.0-2.0 %    ABS. NEUTROPHILS  3.2  1.6-7.8 x10E3/uL    Abs Lymphocytes 1.7  1.0-4.5 x10E3/uL    ABS. MONOCYTES 0.3  <1.0 x10E3/uL    ABS. EOSINOPHILS 0.1  <0.7 x10E3/uL    ABS. BASOPHILS 0.0  <0.3 x10E3/uL    RDW-CV 13.2  11.5-15.0 %    PLATELET 229  150-450 x10E3/uL    MEAN PLATELET VOLUME 8.9  6.8-13.0 fL   URINALYSIS W/MICROSCOPIC       Result Value Ref Range    Color Yellow  Yellow - Straw    Appearance Clear  Clear - Clear    pH (UA) 6.5  5.0-8.0    Specific gravity 1.022  1.005-1.030 R.I.    Bilirubin Negative  Negative - Negative    Blood Negative  Negative - Trace    Leukocyte Esterase Small (*) Negative - Negative    Nitrites Negative  Negative - Negative    Glucose Negative  Negative - Negative mg/dL    Ketone Negative  Negative - Negative mg/dL    Protein Negative  Negative - Negative mg/dL    Urobilinogen 0.2  0.0-1.0 mg/dL    RBC Urine None Seen  None Seen - <5 /HPF    WBC 11-20 (*) <5 - <5 /HPF    Bacteria Few (*) None seen /HPF    Epithelial Cells Few (*) None seen - None seen /HPF    Crystals None seen  None seen - None seen  /HPF    Yeast, urine None seen  None seen - None seen /HPF   LIPID PANEL       Result Value Ref Range    Cholesterol, total 197  125-200 mg/dL    HDL Cholesterol 70  >=46 mg/dL    T. Chol/HDL Ratio 2.8  <5.00 Ratio    Non-HDL Cholesterol 127      HDL Chol (%) 35.7  >15 %    LDL, calculated 114  <130 mg/dL    VLDL, calculated 13  8-35    Triglyceride 64  <150 mg/dL   AMB POC RAPID STREP A       Result Value Ref Range    VALID INTERNAL CONTROL POC No      Group A Strep Ag Negative  Negative       Health Maintenance   Topic Date Due   ??? INFLUENZA AGE 71 TO ADULT  04/11/2014   ??? PAP AKA CERVICAL CYTOLOGY  01/08/2016   ??? Td Q 10 Yrs Age > 18  01/15/2023   ??? TDAP AGE > 18  Completed       Assessment/Plan:  Lekeisha was seen today for complete physical.    Diagnoses and associated orders for this visit:    Routine general medical examination at a health care facility    Multiple thyroid nodules TFt's stable        Medications Discontinued During This Encounter   Medication Reason   ??? predniSONE (DELTASONE) 10 mg tablet Therapy Completed   ??? krill-om-3-dha-epa-phospho-ast 1,000-170-80-50 mg cap Not A Current Medication   ??? glucosamine (GLUCOSAMINE RELIEF) 1,000 mg tab Not A Current Medication       Total time spent with patient:  30 minutes   '[]'$   >50% of visit spent in counseling and coordination of care on above issues     Care Plan discussed with:   '[x]'$ Patient  $Remove'[]'RgqoTjN$ Family  $Remov'[]'taaDhF$ Care Manager  $Remove'[]'waOtgGH$ Nursing  $Remove'[]'UKuzRsP$ Consultant/Specialist              Discussed:   '[]'$   Advanced Directives   [x] diet  [x]excercise   []seat belts    [] smoking cessation  []safe sex practices   []sun screen usage  []weight loss recommended    []referred to diabetes educator   []carbohydrate counting reviewed   []blood sugar goals reviewed  []complications of diabetes reviewed    []  []         Obert Espindola, MD   __________________________________________________

## 2014-01-30 NOTE — Patient Instructions (Signed)
Well Visit, Ages 18 to 50: After Your Visit  Your Care Instructions  Physical exams can help you stay healthy. Your doctor has checked your overall health and may have suggested ways to take good care of yourself. He or she also may have recommended tests. At home, you can help prevent illness with healthy eating, regular exercise, and other steps.  Follow-up care is a key part of your treatment and safety. Be sure to make and go to all appointments, and call your doctor if you are having problems. It's also a good idea to know your test results and keep a list of the medicines you take.  How can you care for yourself at home?  ?? Reach and stay at a healthy weight. This will lower your risk for many problems, such as obesity, diabetes, heart disease, and high blood pressure.  ?? Get at least 30 minutes of physical activity on most days of the week. Walking is a good choice. You also may want to do other activities, such as running, swimming, cycling, or playing tennis or team sports. Discuss any changes in your exercise program with your doctor.  ?? Do not smoke or allow others to smoke around you. If you need help quitting, talk to your doctor about stop-smoking programs and medicines. These can increase your chances of quitting for good.  ?? Talk to your doctor about whether you have any risk factors for sexually transmitted infections (STIs). Having one sex partner (who does not have STIs and does not have sex with anyone else) is a good way to avoid these infections.  ?? Use birth control if you do not want to have children at this time. Talk with your doctor about the choices available and what might be best for you.  ?? Always wear sunscreen on exposed skin. Make sure the sunscreen blocks ultraviolet rays (both UVA and UVB) and has a sun protection factor (SPF) of at least 15. Use it every day, even when it is cloudy. Some doctors may recommend a higher SPF, such as 30.  ?? See a dentist one or two times a year  for checkups and to have your teeth cleaned.  ?? Wear a seat belt in the car.  ?? Drink alcohol in moderation, if at all. That means no more than 2 drinks a day for men and 1 drink a day for women.  Follow your doctor's advice about when to have certain tests. These tests can spot problems early.  For everyone  ?? Cholesterol. Have the fat (cholesterol) in your blood tested after age 20. Your doctor will tell you how often to have this done based on your age, family history, or other things that can increase your risk for heart disease.  ?? Blood pressure. Experts suggest that healthy adults with normal blood pressure (119/79 mm Hg or below) have their blood pressure checked at least every 1 to 2 years. This can be done during a routine doctor visit. If you have slightly higher or high blood pressure, your doctor will suggest more frequent tests.  ?? Vision. Talk with your doctor about how often to have a glaucoma test.  ?? Diabetes. Ask your doctor whether you should have tests for diabetes.  ?? Colon cancer. Have a test for colon cancer at age 35. You may have one of several tests. If you are younger than 50, you may need a test earlier if you have any risk factors. Risk factors include whether you already   had a precancerous polyp removed from your colon or whether your parent, brother, sister, or child has had colon cancer.  For women  ?? Breast exam and mammogram. Talk to your doctor about when you should have a clinical breast exam and a mammogram. Medical experts differ on whether and how often women 35 should have these tests. Your doctor can help you decide what is right for you.  ?? Pap test and pelvic exam. Begin Pap tests at age 21. A Pap test is the best way to find cervical cancer. The test often is part of a pelvic exam. Ask how often to have this test.  ?? Tests for sexually transmitted infections (STIs). Ask whether you should have tests for STIs. You may be at risk if you have sex with more than one  person, especially if your partners do not wear condoms.  For men  ?? Tests for sexually transmitted infections (STIs). Ask whether you should have tests for STIs. You may be at risk if you have sex with more than one person, especially if you do not wear a condom.  ?? Testicular cancer exam. Ask your doctor whether you should check your testicles regularly.  ?? Prostate exam. Talk to your doctor about whether you should have a blood test (called a PSA test) for prostate cancer. Experts differ on whether and when men should have this test. Some experts suggest it if you are older than 45 and are African-American or have a father or brother who got prostate cancer when he was younger than 65.  When should you call for help?  Watch closely for changes in your health, and be sure to contact your doctor if you have any problems or symptoms that concern you.   Where can you learn more?   Go to http://www.healthwise.net/[facility]  Enter P072 in the search box to learn more about "Well Visit, Ages 18 to 50: After Your Visit."   ?? 2006-2015 Healthwise, Incorporated. Care instructions adapted under license by [employer] (which disclaims liability or warranty for this information). This care instruction is for use with your licensed healthcare professional. If you have questions about a medical condition or this instruction, always ask your healthcare professional. [employer], [employer] disclaims any warranty or liability for your use of this information.  Content Version: 10.4.390249; Current as of: November 20, 2012

## 2014-02-16 ENCOUNTER — Encounter

## 2014-07-08 NOTE — Progress Notes (Signed)
A user error has taken place: chart opened in error

## 2014-07-21 ENCOUNTER — Ambulatory Visit
Admit: 2014-07-21 | Discharge: 2014-07-21 | Payer: PRIVATE HEALTH INSURANCE | Attending: Internal Medicine | Primary: Internal Medicine

## 2014-07-21 DIAGNOSIS — J302 Other seasonal allergic rhinitis: Secondary | ICD-10-CM

## 2014-07-21 MED ORDER — SERTRALINE 25 MG TAB
25 mg | ORAL_TABLET | ORAL | Status: DC
Start: 2014-07-21 — End: 2014-08-25

## 2014-07-21 NOTE — Patient Instructions (Signed)
Vaccine Information Statement    Influenza (Flu) Vaccine (Inactivated or Recombinant): What you need to know    Many Vaccine Information Statements are available in Spanish and other languages. See www.immunize.org/vis  Hojas de Informaci??n Sobre Vacunas est??n disponibles en Espa??ol y en muchos otros idiomas. Visite www.immunize.org/vis    1. Why get vaccinated?    Influenza (???flu???) is a contagious disease that spreads around the [place] every year, usually between October and May.     Flu is caused by influenza viruses, and is spread mainly by coughing, sneezing, and close contact.     Anyone can get flu. Flu strikes suddenly and can last several days. Symptoms vary by age, but can include:  ??? fever/chills  ??? sore throat  ??? muscle aches  ??? fatigue  ??? cough  ??? headache   ??? runny or stuffy nose    Flu can also lead to pneumonia and blood infections, and cause diarrhea and seizures in children.  If you have a medical condition, such as heart or lung disease, flu can make it worse.    Flu is more dangerous for some people. Infants and young children, people 65 years of age and older, pregnant women, and people with certain health conditions or a weakened immune system are at greatest risk.      Each year thousands of people in the [place] die from flu, and many more are hospitalized.     Flu vaccine can:  ??? keep you from getting flu,  ??? make flu less severe if you do get it, and  ??? keep you from spreading flu to your family and other people.     2. Inactivated and recombinant flu vaccines    A dose of flu vaccine is recommended every flu season. Children 6 months through 8 years of age may need two doses during the same flu season.  Everyone else needs only one dose each flu season.       Some inactivated flu vaccines contain a very small amount of a mercury-based preservative called thimerosal. Studies have not shown thimerosal in vaccines to be harmful, but flu vaccines that do not contain  thimerosal are available.    There is no live flu virus in flu shots.  They cannot cause the flu.     There are many flu viruses, and they are always changing. Each year a new flu vaccine is made to protect against three or four viruses that are likely to cause disease in the upcoming flu season. But even when the vaccine doesn???t exactly match these viruses, it may still provide some protection    Flu vaccine cannot prevent:  ??? flu that is caused by a virus not covered by the vaccine, or  ??? illnesses that look like flu but are not.    It takes about 2 weeks for protection to develop after vaccination, and protection lasts through the flu season.     3. Some people should not get this vaccine    Tell the person who is giving you the vaccine:    ??? If you have any severe, life-threatening allergies.    If you ever had a life-threatening allergic reaction after a dose of flu vaccine, or have a severe allergy to any part of this vaccine, you may be advised not to get vaccinated.  Most, but not all, types of flu vaccine contain a small amount of egg protein.       ??? If you   ever had Guillain-Barr?? Syndrome (also called GBS).   Some people with a history of GBS should not get this vaccine. This should be discussed with your doctor.    ??? If you are not feeling well.    It is usually okay to get flu vaccine when you have a mild illness, but you might be asked to come back when you feel better.      4. Risks of a vaccine reaction    With any medicine, including vaccines, there is a chance of reactions. These are usually mild and go away on their own, but serious reactions are also possible.     Most people who get a flu shot do not have any problems with it.     Minor problems following a flu shot include:   ??? soreness, redness, or swelling where the shot was given    ??? hoarseness  ??? sore, red or itchy eyes  ??? cough  ??? fever  ??? aches  ??? headache  ??? itching  ??? fatigue   If these problems occur, they usually begin soon after the shot and last 1 or 2 days.     More serious problems following a flu shot can include the following:    ??? There may be a small increased risk of Guillain-Barr?? Syndrome (GBS) after inactivated flu vaccine.  This risk has been estimated at 1 or 2 additional cases per million people vaccinated. This is much lower than the risk of severe complications from flu, which can be prevented by flu vaccine.      ??? Young children who get the flu shot along with pneumococcal vaccine (PCV13) and/or DTaP vaccine at the same time might be slightly more likely to have a seizure caused by fever. Ask your doctor for more information. Tell your doctor if a child who is getting flu vaccine has ever had a seizure.     Problems that could happen after any injected vaccine:     ??? People sometimes faint after a medical procedure, including vaccination. Sitting or lying down for about 15 minutes can help prevent fainting, and injuries caused by a fall. Tell your doctor if you feel dizzy, or have vision changes or ringing in the ears.    ??? Some people get severe pain in the shoulder and have difficulty moving the arm where a shot was given. This happens very rarely.    ??? Any medication can cause a severe allergic reaction. Such reactions from a vaccine are very rare, estimated at about 1 in a million doses, and would happen within a few minutes to a few hours after the vaccination.    As with any medicine, there is a very remote chance of a vaccine causing a serious injury or death.    The safety of vaccines is always being monitored. For more information, visit: www.cdc.gov/vaccinesafety/    5. What if there is a serious reaction?    What should I look for?    ??? Look for anything that concerns you, such as signs of a severe allergic reaction, very high fever, or unusual behavior.    Signs of a severe allergic reaction can include hives, swelling of the  face and throat, difficulty breathing, a fast heartbeat, dizziness, and weakness ??? usually within a few minutes to a few hours after the vaccination.    What should I do?    ??? If you think it is a severe allergic reaction or other emergency that   can???t wait, call 9-1-1 and get the person to the nearest hospital. Otherwise, call your doctor.    ??? Reactions should be reported to the Vaccine Adverse Event Reporting System (VAERS). Your doctor should file this report, or you can do it yourself through  the VAERS web site at www.vaers.hhs.gov, or by calling 1-800-822-7967.    VAERS does not give medical advice.    6. The National Vaccine Injury Compensation Program    The National Vaccine Injury Compensation Program (VICP) is a federal program that was created to compensate people who may have been injured by certain vaccines.    Persons who believe they may have been injured by a vaccine can learn about the program and about filing a claim by calling 1-800-338-2382 or visiting the VICP website at www.hrsa.gov/vaccinecompensation.  There is a time limit to file a claim for compensation.    7. How can I learn more?  ??? Ask your healthcare provider. He or she can give you the vaccine package insert or suggest other sources of information.  ??? Call your local or state health department.  ??? Contact the Centers for Disease Control and Prevention (CDC):  - Call 1-800-232-4636 (1-800-CDC-INFO) or  - Visit CDC???s website at www.cdc.gov/flu    Vaccine Information Statement   Inactivated Influenza Vaccine   04/17/2014  42 U[place] ?? [id number]    Department of Health and Human Services  Centers for Disease Control and Prevention    Office Use Only

## 2014-07-21 NOTE — Progress Notes (Signed)
Chief Complaint:  33 y.o. 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 Has been using OTC antihistamine and steroid nasal spray as needed  Concerned re bony prominence right anterior chest wall - non painful, no erythema, warmth, discharge.   Reports irritability, easily annoyed - lack of motivation in the morning. Feels on edge.   ROS: Denies CP, SOB, abdominal pain, dysuria, HA, fever, chills, rash      Allergies   Allergen Reactions   ??? Macrobid [Nitrofurantoin Monohyd/M-Cryst] Shortness of Breath     Current Outpatient Prescriptions   Medication Sig Dispense Refill   ??? fluticasone (FLONASE) 50 mcg/actuation nasal spray 2 Sprays by Both Nostrils route daily. As needed     ??? sertraline (ZOLOFT) 25 mg tablet 1 tab daily x 4 days, then 2 tabs daily 60 Tab 1   ??? gluc-chon-msm#1-C-mang-bos-bor (OSTEO BI-FLEX TRIPLE STRENGTH) 750 mg-644 mg- 30 mg-1 mg tab Take  by mouth.     ??? cyanocobalamin (VITAMIN B-12) 500 mcg tablet Take 500 mcg by mouth daily.     ??? loratadine (CLARITIN) 10 mg tablet Take 1 Tab by mouth daily. 30 Tab 0   ??? PRENAT VIT COMB.10/IRON/FA/DHA (CAVAN-FOLATE DHA PO) Take  by mouth.     ??? cholecalciferol, vitamin D3, (VITAMIN D3) 2,000 unit tab Take  by mouth.     ??? omega-3 fatty acids-vitamin e (FISH OIL) 1,000 mg cap Take 1 Cap by mouth.       Past Medical History   Diagnosis Date   ??? Multiple thyroid nodules 01/14/2013     Stable on sono 11/09/11   ??? Chronic pain 01/14/2013     R rib, T11 area   ??? Seasonal allergic rhinitis 01/14/2013   ??? B12 deficiency 01/14/2013   ??? Vitamin D deficiency 01/14/2013     History reviewed. No pertinent past surgical history.  History reviewed. No pertinent family history.  History     Social History   ??? Marital Status: MARRIED     Spouse Name: fiance Jennifer Campos     Number of Children: 0   ??? 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 Smoking status: Never Smoker    ??? Smokeless tobacco: Never Used   ??? Alcohol Use: Yes      Comment: 1 drink/week   ??? Drug Use: No   ??? Sexual Activity:     Partners: Male     Other Topics Concern   ??? Not on file     Social History Narrative       PHQ 2 / 9, over the last two weeks 01/30/2014   Little interest or pleasure in doing things Not at all   Feeling down, depressed or hopeless Not at all   Total Score PHQ 2 0     No flowsheet data found.      BP 90/60 mmHg   Pulse 71   Temp(Src) 98.8 ??F (37.1 ??C) (Oral)   Ht 5\' 6"  (1.676 m)   Wt 143 lb (64.864 kg)   BMI 23.09 kg/m2   SpO2 99%    PHYSICAL EXAM:  General appearance  WDWN  NAD B/l nasal congestion R>L Small effusion R TM    Eyes  Perrl  EOMI  Conjunct and lids  ENT  Ext NL  Oral Cav NL  No hearing loss  Intern NL  Lips teeth gums NL   Neck  NL Appearance  Thyroid NL      Respiratory  BS WNL  Chest percussion WNL  NL resp excursion  Chest palpation WNL    CVS  RRR  No Bruits  No edema/varicos  No murmur  Fem/Carotid pulse WNL     2+Bilat DP and PT pulses  NL palpation      Chest/Breast  Gen apppear WNL  No masses or discharge No mass, normal rib formation     GI  No HSM  No rectal mass  Soft, Non-tender  No hernia    GU-Female  NL ext genitalia  Prostate WNL  As per Uro     GU- Female  NL ext genitalia and vagina  External exam NL  as per GYN     Lymph  Neg neck nodes   Neg groin nodes  Neg supraclav nodes  Neg axillae nodes    Skin  No rash, lesions, or ulcers  Warm and dry      Neuro  Reflexes WNL  Sensory WNL   CN 2-12 WNL     Psych  Alert  Oriented x 3  Memory intact     Mus   Power/strength 5/5 bilat  Joints WNL  No scoliosis  Gait WNL  No spinal tenderness     No rigidity tremor  No kyphosis  No C/C/E            Health Maintenance   Topic Date Due   ??? INFLUENZA AGE 36 TO ADULT  04/12/2015   ??? PAP AKA CERVICAL CYTOLOGY  01/08/2016   ??? Td Q 10 Yrs Age > 18  01/15/2023   ??? Tdap Age > 18  Completed        Assessment/Plan:  Jennifer Campos was seen today for injection, mass and anxiety.    Diagnoses and all orders for this visit:    Seasonal allergic rhinitis - continue OTC antihistamines, nasal steroid    Perennial allergic rhinitis with seasonal variation    Post-nasal drip    Encounter for immunization  Orders:  -     Influenza virus vaccine (QUADRIVALENT PRES FREE SYRINGE) IM 3 years and older    Dysthymia - had been on Cymbalta in past, when she had more depression symptoms and various physical complaints - reviewed mechanism of action, onset of action, potential side effects of SSRI/SNRI drugs  Orders:  -     sertraline (ZOLOFT) 25 mg tablet; 1 tab daily x 4 days, then 2 tabs daily    Will call/email with update    Total time spent with patient:  35 minutes     >50% of visit spent in counseling and coordination of care on above issues     Care Plan discussed with:   Patient  Family  Care Manager  Nursing  Consultant/Specialist              Discussed:   Advanced Directives    diet  excercise   seat belts     smoking cessation  safe sex practices   sun screen usage  weight loss recommended    referred to diabetes educator   carbohydrate counting reviewed   blood sugar goals reviewed  complications of diabetes reviewed               Jennifer Calleriane Hendrix Yurkovich, MD   __________________________________________________

## 2014-08-25 ENCOUNTER — Encounter

## 2014-08-25 ENCOUNTER — Ambulatory Visit
Admit: 2014-08-25 | Discharge: 2014-08-25 | Payer: PRIVATE HEALTH INSURANCE | Attending: Internal Medicine | Primary: Internal Medicine

## 2014-08-25 DIAGNOSIS — R590 Localized enlarged lymph nodes: Secondary | ICD-10-CM

## 2014-08-25 MED ORDER — SERTRALINE 25 MG TAB
25 mg | ORAL_TABLET | Freq: Every day | ORAL | Status: DC
Start: 2014-08-25 — End: 2014-09-16

## 2014-08-25 NOTE — Patient Instructions (Signed)
Abnormal Sweating: After Your Visit  Your Care Instructions  Sweating is your body's way of cooling down and getting rid of some chemicals. But some people have a condition that makes them sweat too much. It can affect any part of your body, especially the head, armpits, hands, and feet. Sometimes the sweat mixes with bacteria on your skin and causes armpits and feet to smell bad.  It can be upsetting to have sweat drip from your face and palms or to have smelly feet and shoes. Some people seem to be born with this condition, while some others may sweat too much because of anxiety. You may be able to reduce the amount you sweat by lowering stress in your life. Some people find that antiperspirants help, and you can take steps at home that will help with smelly feet. If you still have too much sweating, your doctor may recommend other treatments.  Follow-up care is a key part of your treatment and safety. Be sure to make and go to all appointments, and call your doctor if you are having problems. It???s also a good idea to know your test results and keep a list of the medicines you take.  How can you care for yourself at home?  ?? If your doctor prescribed medicine, use it as directed. Call your doctor if you have any problems with your medicine. You will get more details on the specific medicines your doctor prescribes.  ?? Bathe 1 or 2 times a day with antibacterial soap and water. Do not scrub your skin too much, because that can irritate it. Dry your skin well after bathing.  ?? Use a deodorant with antiperspirant. It might help to put it on at night before bed.  ?? Wear clothing made of material that lets your skin breathe. Cotton, wool, silk, and linen are good choices. For exercising, wear material that removes (wicks) the moisture from your skin.  ?? Keep an extra shirt at work or in a school locker.  ?? Attach pads (underarm or dress shields) to the armpit area of clothing  to absorb sweat. You can buy these pads in sports or clothing stores.  ?? Let your shoes dry out for a day after wearing them. If possible, set them in a place where the sun will shine on them. That will help kill the bacteria that cause the smell.  ?? Change your socks at least 1 time a day. Wash your socks after each wearing.  ?? Use foot powder or talc in your shoes and socks and on your feet. Put inserts in your shoes to absorb some of the sweat. Go barefoot for a while each day to let your feet dry out.  ?? Limit hot drinks, such as coffee and tea, which make you sweat more.  When should you call for help?  Watch closely for changes in your health, and be sure to contact your doctor if:  ?? You continue to sweat too much, and it bothers you.   Where can you learn more?   Go to http://www.healthwise.net/[facility]  Enter U639 in the search box to learn more about "Abnormal Sweating: After Your Visit."   ?? 2006-2015 Healthwise[employer] Inc[employer]. Care instructions adapted under license by  (which disclaims liability or warranty for this information). This care instruction is for use with your licensed healthcare professional. If you have questions about a medical condition or this instruction, always ask your healthcare professional. Healthwise, Incorporated disclaims any warranty or liability for   your use of this information.  Content Version: 10.5.422740; Current as of: July 25, 2013
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
## 2014-08-26 LAB — CBC WITH AUTOMATED DIFF
ABS. BASOPHILS: 0 10*3/uL (ref <0.3)
ABS. EOSINOPHILS: 0.1 10*3/uL (ref <0.7)
ABS. MONOCYTES: 0.3 10*3/uL (ref <1.0)
ABS. NEUTROPHILS: 4.1 10*3/uL (ref 1.6–7.8)
Abs Lymphocytes: 1.6 10*3/uL (ref 1.0–4.5)
BASOPHILS: 0.6 % (ref 0.0–2.0)
EOSINOPHILS: 1.7 % (ref 0.0–7.0)
HCT: 42.4 % (ref 34.0–47.0)
HGB: 14 g/dL (ref 11.0–15.5)
Lymphocytes: 26.3 % (ref 15.0–45.0)
MCH: 32.3 pg (ref 25.0–33.0)
MCHC: 33 g/dL (ref 30.0–35.0)
MCV: 98 fL (ref 80–102)
MEAN PLATELET VOLUME: 7.4 fL (ref 6.8–13.0)
MONOCYTES: 4.5 % (ref 2.0–10.0)
NEUTROPHILS: 67 % (ref 40.0–70.0)
PLATELET: 240 10*3/uL (ref 150–450)
RBC: 4.34 x10E6/uL (ref 3.70–5.20)
RDW-CV: 12.3 % (ref 11.5–15.0)
WBC: 6.1 10*3/uL (ref 4.0–11.0)

## 2014-08-26 LAB — LD: LD: 148 U/L (ref 100–200)

## 2014-09-15 ENCOUNTER — Encounter

## 2014-09-16 MED ORDER — SERTRALINE 25 MG TAB
25 mg | ORAL_TABLET | Freq: Every day | ORAL | Status: DC
Start: 2014-09-16 — End: 2014-11-12

## 2014-11-12 MED ORDER — SERTRALINE 25 MG TAB
25 mg | ORAL_TABLET | ORAL | Status: DC
Start: 2014-11-12 — End: 2014-11-17

## 2014-11-18 MED ORDER — SERTRALINE 25 MG TAB
25 mg | ORAL_TABLET | ORAL | Status: DC
Start: 2014-11-18 — End: 2015-01-07

## 2015-01-07 MED ORDER — SERTRALINE 25 MG TAB
25 mg | ORAL_TABLET | ORAL | Status: DC
Start: 2015-01-07 — End: 2015-01-31

## 2015-01-21 ENCOUNTER — Encounter

## 2015-01-23 ENCOUNTER — Encounter

## 2015-02-01 ENCOUNTER — Encounter: Attending: Internal Medicine | Primary: Internal Medicine

## 2015-02-01 MED ORDER — SERTRALINE 25 MG TAB
25 mg | ORAL_TABLET | ORAL | Status: DC
Start: 2015-02-01 — End: 2015-03-11

## 2015-02-25 ENCOUNTER — Encounter: Attending: Internal Medicine | Primary: Internal Medicine

## 2015-03-12 MED ORDER — SERTRALINE 25 MG TAB
25 mg | ORAL_TABLET | ORAL | Status: DC
Start: 2015-03-12 — End: 2015-04-17

## 2015-04-15 NOTE — Progress Notes (Signed)
Quick Note:        Add HbA1c - diagnosis impaired fasting glucose    ______

## 2015-04-15 NOTE — Progress Notes (Signed)
Quick Note:        Called lab spoke to [facility] ref # [id number]    A1c will be added    ______

## 2015-04-19 MED ORDER — SERTRALINE 25 MG TAB
25 mg | ORAL_TABLET | ORAL | Status: DC
Start: 2015-04-19 — End: 2015-05-19

## 2015-05-19 MED ORDER — SERTRALINE 25 MG TAB
25 mg | ORAL_TABLET | ORAL | 0 refills | Status: DC
Start: 2015-05-19 — End: 2015-06-18

## 2015-06-04 ENCOUNTER — Ambulatory Visit
Admit: 2015-06-04 | Discharge: 2015-06-04 | Payer: PRIVATE HEALTH INSURANCE | Attending: Internal Medicine | Primary: Internal Medicine

## 2015-06-04 ENCOUNTER — Encounter

## 2015-06-04 DIAGNOSIS — Z0000 Encounter for general adult medical examination without abnormal findings: Secondary | ICD-10-CM

## 2015-06-04 NOTE — Progress Notes (Signed)
Chief Complaint:  34 y.o. WHITE OR CAUCASIAN female presents for CPE  HPI:  Running @ 30 miles/week, training for a marathon. 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 Reports irritability if she does not eat every 2.5 hours. Also reports right sided discomfort, similar to previous, since she has gained weight.   Does have some relief with acupuncture.   Also notes some blurry vision when focusing on objects in distance - sporadic. Accomodates after a minute.  ROS: Denies CP, SOB, abdominal pain, dysuria, HA, fever, chills, rash      Allergies   Allergen Reactions   ??? Macrobid [Nitrofurantoin Monohyd/M-Cryst] Shortness of Breath     Current Outpatient Prescriptions   Medication Sig Dispense Refill   ??? cholecalciferol (VITAMIN D3) 1,000 unit cap Take  by mouth daily.     ??? Glucosamine HCl-MSM (GLUCOSAMINE MSM) 1,500-500 mg/30 mL liqd Take  by mouth.     ??? sertraline (ZOLOFT) 25 mg tablet TAKE 3 TABLETS BY MOUTH EVERY DAY 90 Tab 0   ??? fluticasone (FLONASE) 50 mcg/actuation nasal spray 2 Sprays by Both Nostrils route daily. As needed     ??? cyanocobalamin (VITAMIN B-12) 500 mcg tablet Take 500 mcg by mouth daily.     ??? loratadine (CLARITIN) 10 mg tablet Take 1 Tab by mouth daily. 30 Tab 0   ??? PRENAT VIT COMB.10/IRON/FA/DHA (CAVAN-FOLATE DHA PO) Take  by mouth.     ??? omega-3 fatty acids-vitamin e (FISH OIL) 1,000 mg cap Take 1 Cap by mouth.       Past Medical History   Diagnosis Date   ??? B12 deficiency 01/14/2013   ??? Chronic pain 01/14/2013     R rib, T11 area   ??? Multiple thyroid nodules 01/14/2013     Stable on sono 11/09/11   ??? Seasonal allergic rhinitis 01/14/2013   ??? Vitamin D deficiency 01/14/2013     History reviewed. No pertinent past surgical history.  History reviewed. No pertinent family history.  Social History     Social History   ??? Marital status: MARRIED     Spouse name: fiance Nimo Verastegui   ??? Number of children: 0   ??? 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 Smoking status: Never Smoker   ??? Smokeless tobacco: Never Used   ??? Alcohol use Yes      Comment: 1 drink/week   ??? Drug use: No   ??? Sexual activity: Yes     Partners: Male     Other Topics Concern   ??? Not on file     Social History Narrative       PHQ 2 / 9, over the last two weeks 06/04/2015   Little interest or pleasure in doing things Not at all   Feeling down, depressed or hopeless Not at all   Total Score PHQ 2 0   Trouble falling or staying asleep, or sleeping too much Not at all   Feeling tired or having little energy Not at all   Poor appetite or overeating Not at all   Feeling bad about yourself - or that you are a failure or have let yourself or your family down Not at all   Trouble concentrating on things such as school, work, reading or watching TV Not at all   Moving or speaking  so slowly that other people could have noticed; or the opposite being so fidgety that others notice Not at all   Thoughts of being better off dead, or hurting yourself in some way Not at all   PHQ 9 Score 0   How difficult have these problems made it for you to do your work, take care of your home and get along with others Not difficult at all       Visit Vitals   ??? BP 105/75   ??? Pulse 78   ??? Temp 97.8 ??F (36.6 ??C) (Oral)   ??? Ht 5' 5.5" (1.664 m)   ??? Wt 153 lb (69.4 kg)   ??? SpO2 99%   ??? BMI 25.07 kg/m2       PHYSICAL EXAM:  General appearance  WDWN  NAD      Eyes  Perrl  EOMI  Conjunct and lids     ENT  Ext NL  Oral Cav NL  No hearing loss  Intern NL  Lips teeth gums NL   Neck  NL Appearance  Thyroid NL      Respiratory  BS WNL  Chest percussion WNL  NL resp excursion  Chest palpation WNL    CVS  RRR  No Bruits  No edema/varicos  No murmur  Fem/Carotid pulse WNL     2+Bilat DP and PT pulses  NL palpation      Chest/Breast  Gen apppear WNL  No masses or discharge      GI  No HSM  No rectal mass  Soft, Non-tender  No hernia RUQ "soreness" on deep palpation    GU-Female  NL ext genitalia  Prostate WNL  As per Uro     GU- Female  NL ext genitalia and vagina  External exam NL  as per GYN     Lymph  Neg neck nodes   Neg groin nodes  Neg supraclav nodes  Neg axillae nodes    Skin  No rash, lesions, or ulcers  Warm and dry      Neuro  Reflexes WNL  Sensory WNL   CN 2-12 WNL     Psych  Alert  Oriented x 3  Memory intact     Mus   Power/strength 5/5 bilat  Joints WNL  No scoliosis  Gait WNL  No spinal tenderness     No rigidity tremor  No kyphosis  No C/C/E        Labs 04/14/15 reviewed - Fasting Glucose 125      Health Maintenance   Topic Date Due   ??? PAP AKA CERVICAL CYTOLOGY  01/08/2016   ??? DTaP/Tdap/Td series (2 - Td) 01/15/2023   ??? INFLUENZA AGE 92 TO ADULT  Completed       Assessment/Plan:  Yarely was seen today for complete physical.    Diagnoses and all orders for this visit:    Routine general medical examination at a health care facility    IFG (impaired fasting glucose) - reviewed pairing carbs with protein, portion control  -     HEMOGLOBIN A1C W/O EAG  -     METABOLIC PANEL, BASIC; Future  -     REFERRAL TO DIABETIC EDUCATION    Vitamin D deficiency  -     VITAMIN D, 25 HYDROXY; Future    Encounter for immunization  -     Influenza virus vaccine (QUADRIVALENT PRES FREE SYRINGE) IM 3 years and older    Blurry vision  -  REFERRAL TO OPHTHALMOLOGY        Medications Discontinued During This Encounter   Medication Reason   ??? cholecalciferol, vitamin D3, (VITAMIN D3) 2,000 unit tab Dose Adjustment   ??? gluc-chon-msm#1-C-mang-bos-bor (OSTEO BI-FLEX TRIPLE STRENGTH) 750 mg-644 mg- 30 mg-1 mg tab Not A Current Medication       Total time spent with patient:  40 minutes     >50% of visit spent in counseling and coordination of care on above issues     Care Plan discussed with:   Patient  Family  Care Manager  Nursing  Consultant/Specialist              Discussed:   Advanced Directives    diet  excercise   seat belts      smoking cessation  safe sex practices   sun screen usage  weight loss recommended    referred to diabetes educator   carbohydrate counting reviewed   blood sugar goals reviewed  complications of diabetes reviewed               Murlean Caller, MD   __________________________________________________

## 2015-06-04 NOTE — Patient Instructions (Addendum)
Vaccine Information Statement    Influenza (Flu) Vaccine (Inactivated or Recombinant): What you need to know    Many Vaccine Information Statements are available in Spanish and other languages. See www.immunize.org/vis  Hojas de Informaci??n Sobre Vacunas est??n disponibles en Espa??ol y en muchos otros idiomas. Visite www.immunize.org/vis    1. Why get vaccinated?    Influenza (???flu???) is a contagious disease that spreads around the [place] every year, usually between October and May.     Flu is caused by influenza viruses, and is spread mainly by coughing, sneezing, and close contact.     Anyone can get flu. Flu strikes suddenly and can last several days. Symptoms vary by age, but can include:  ??? fever/chills  ??? sore throat  ??? muscle aches  ??? fatigue  ??? cough  ??? headache   ??? runny or stuffy nose    Flu can also lead to pneumonia and blood infections, and cause diarrhea and seizures in children.  If you have a medical condition, such as heart or lung disease, flu can make it worse.    Flu is more dangerous for some people. Infants and young children, people 65 years of age and older, pregnant women, and people with certain health conditions or a weakened immune system are at greatest risk.      Each year thousands of people in the [place] die from flu, and many more are hospitalized.     Flu vaccine can:  ??? keep you from getting flu,  ??? make flu less severe if you do get it, and  ??? keep you from spreading flu to your family and other people.     2. Inactivated and recombinant flu vaccines    A dose of flu vaccine is recommended every flu season. Children 6 months through 8 years of age may need two doses during the same flu season.  Everyone else needs only one dose each flu season.       Some inactivated flu vaccines contain a very small amount of a mercury-based preservative called thimerosal. Studies have not shown thimerosal in vaccines to be harmful, but flu vaccines that do not contain  thimerosal are available.    There is no live flu virus in flu shots.  They cannot cause the flu.     There are many flu viruses, and they are always changing. Each year a new flu vaccine is made to protect against three or four viruses that are likely to cause disease in the upcoming flu season. But even when the vaccine doesn???t exactly match these viruses, it may still provide some protection    Flu vaccine cannot prevent:  ??? flu that is caused by a virus not covered by the vaccine, or  ??? illnesses that look like flu but are not.    It takes about 2 weeks for protection to develop after vaccination, and protection lasts through the flu season.     3. Some people should not get this vaccine    Tell the person who is giving you the vaccine:    ??? If you have any severe, life-threatening allergies.    If you ever had a life-threatening allergic reaction after a dose of flu vaccine, or have a severe allergy to any part of this vaccine, you may be advised not to get vaccinated.  Most, but not all, types of flu vaccine contain a small amount of egg protein.       ??? If you   ever had Guillain-Barr?? Syndrome (also called GBS).   Some people with a history of GBS should not get this vaccine. This should be discussed with your doctor.    ??? If you are not feeling well.    It is usually okay to get flu vaccine when you have a mild illness, but you might be asked to come back when you feel better.      4. Risks of a vaccine reaction    With any medicine, including vaccines, there is a chance of reactions. These are usually mild and go away on their own, but serious reactions are also possible.     Most people who get a flu shot do not have any problems with it.     Minor problems following a flu shot include:   ??? soreness, redness, or swelling where the shot was given    ??? hoarseness  ??? sore, red or itchy eyes  ??? cough  ??? fever  ??? aches  ??? headache  ??? itching  ??? fatigue   If these problems occur, they usually begin soon after the shot and last 1 or 2 days.     More serious problems following a flu shot can include the following:    ??? There may be a small increased risk of Guillain-Barr?? Syndrome (GBS) after inactivated flu vaccine.  This risk has been estimated at 1 or 2 additional cases per million people vaccinated. This is much lower than the risk of severe complications from flu, which can be prevented by flu vaccine.      ??? Young children who get the flu shot along with pneumococcal vaccine (PCV13) and/or DTaP vaccine at the same time might be slightly more likely to have a seizure caused by fever. Ask your doctor for more information. Tell your doctor if a child who is getting flu vaccine has ever had a seizure.     Problems that could happen after any injected vaccine:     ??? People sometimes faint after a medical procedure, including vaccination. Sitting or lying down for about 15 minutes can help prevent fainting, and injuries caused by a fall. Tell your doctor if you feel dizzy, or have vision changes or ringing in the ears.    ??? Some people get severe pain in the shoulder and have difficulty moving the arm where a shot was given. This happens very rarely.    ??? Any medication can cause a severe allergic reaction. Such reactions from a vaccine are very rare, estimated at about 1 in a million doses, and would happen within a few minutes to a few hours after the vaccination.    As with any medicine, there is a very remote chance of a vaccine causing a serious injury or death.    The safety of vaccines is always being monitored. For more information, visit: www.cdc.gov/vaccinesafety/    5. What if there is a serious reaction?    What should I look for?    ??? Look for anything that concerns you, such as signs of a severe allergic reaction, very high fever, or unusual behavior.    Signs of a severe allergic reaction can include hives, swelling of the  face and throat, difficulty breathing, a fast heartbeat, dizziness, and weakness ??? usually within a few minutes to a few hours after the vaccination.    What should I do?    ??? If you think it is a severe allergic reaction or other emergency that   can???t wait, call 9-1-1 and get the person to the nearest hospital. Otherwise, call your doctor.    ??? Reactions should be reported to the Vaccine Adverse Event Reporting System (VAERS). Your doctor should file this report, or you can do it yourself through  the VAERS web site at www.vaers.LAgents.no, or by calling 1-(786)579-4106.    VAERS does not give medical advice.    6. The National Vaccine Injury Compensation Program    The [employer] Vaccine Injury Compensation Program (VICP) is a federal program that was created to compensate people who may have been injured by certain vaccines.    Persons who believe they may have been injured by a vaccine can learn about the program and about filing a claim by calling 1-920-546-6050 or visiting the VICP website at SpiritualWord.at.  There is a time limit to file a claim for compensation.    7. How can I learn more?  ??? Ask your healthcare provider. He or she can give you the vaccine package insert or suggest other sources of information.  ??? Call your local or state health department.  ??? Contact the Centers for Disease Control and Prevention (CDC):  - Call 404-103-1211 (1-800-CDC-INFO) or  - Visit CDC???s website at BiotechRoom.com.cy    Vaccine Information Statement   Inactivated Influenza Vaccine   04/17/2014  42 [place] ?? 981XB-[id number]    Department of Health and Insurance risk surveyor for Disease Control and Prevention    Office Use Only    Prediabetes: Care Instructions  Your Care Instructions  Prediabetes is a warning sign that you are at risk for getting type 2 diabetes. It means that your blood sugar is higher than it should be. Most people who get type 2 diabetes have prediabetes first. The good news is  that lifestyle changes may help you get your blood sugar back to normal and avoid or delay diabetes. Also, pregnant women who get gestational diabetes may have prediabetes first.  Type 2 diabetes is a disease in which the body does not respond properly to a hormone called insulin. Over time, the body does not make enough of the hormone. Insulin helps sugar from your food enter your body cells to be used as energy.  Without insulin, the sugar cannot get into the cells to do its work. It stays in the blood instead. This can cause high blood sugar levels. A person has diabetes when the blood sugar stays too high too much of the time.  Follow-up care is a key part of your treatment and safety. Be sure to make and go to all appointments, and call your doctor if you are having problems. It's also a good idea to know your test results and keep a list of the medicines you take.  How can you care for yourself at home?  ?? Watch your weight. A healthy weight helps your body use insulin properly.  ?? Eat a balanced diet. This may help you prevent or delay diabetes. Try to eat an even amount of carbohydrate throughout the day. This can help you avoid sudden peaks in blood sugar.  ?? Ask your doctor if you should see a dietitian. A registered dietitian can help you develop a meal plan that fits your lifestyle.  ?? Get at least 30 minutes of exercise on most days of the week. Exercise helps control your blood sugar. It also helps you maintain a healthy weight. Walking is a good choice. You also may want to do other activities, such  as running, swimming, cycling, or playing tennis or team sports.  ?? Do not smoke. Smoking can make prediabetes worse. If you need help quitting, talk to your doctor about stop-smoking programs and medicines. These can increase your chances of quitting for good.  ?? If your doctor prescribed medicines, take them exactly as prescribed. Call your doctor if you think you are having a problem with your medicine.  You will get more details on the specific medicines your doctor prescribes.  When should you call for help?  Watch closely for changes in your health, and be sure to contact your doctor if:  ?? You have any symptoms of diabetes. These may include:  ?? Being thirsty more often.  ?? Urinating more.  ?? Being hungrier.  ?? Losing weight.  ?? Being very tired.  ?? Having blurry vision.  ?? You have a wound that will not heal.  ?? You have an infection that will not go away.  ?? You have problems with your blood pressure.  ?? You want more information about diabetes and how you can keep from getting it.  Where can you learn more?  Go to InsuranceStats.ca  Enter I222 in the search box to learn more about "Prediabetes: Care Instructions."  ?? 2006-2016 Healthwise[employer]. Care instructions adapted under license by [employer] (which disclaims liability or warranty for this information). This care instruction is for use with your licensed healthcare professional. If you have questions about a medical condition or this instruction, always ask your healthcare professional. Healthwise, Incorporated disclaims any warranty or liability for your use of this information.  Content Version: 10.9.538570; Current as of: August 21, 2014

## 2015-06-17 NOTE — Progress Notes (Signed)
Sugar is excellent - definitely no diabetes. Vit D is still a little low, continue supplement

## 2015-06-17 NOTE — Progress Notes (Signed)
LMOM FOR PT TO CALL US BACK./LJ

## 2015-06-18 MED ORDER — SERTRALINE 25 MG TAB
25 mg | ORAL_TABLET | ORAL | 0 refills | Status: DC
Start: 2015-06-18 — End: 2015-07-19

## 2015-06-18 NOTE — Progress Notes (Signed)
Spoke to pt. As per MD, advised pt that sugar is excelleent definitely no diabetes. Vit D is still a little low, continue supplement. Pt verbalized understanding.

## 2015-07-19 MED ORDER — SERTRALINE 25 MG TAB
25 mg | ORAL_TABLET | ORAL | 0 refills | Status: DC
Start: 2015-07-19 — End: 2015-07-20

## 2015-07-20 MED ORDER — SERTRALINE 25 MG TAB
25 mg | ORAL_TABLET | ORAL | 0 refills | Status: DC
Start: 2015-07-20 — End: 2015-08-20

## 2015-08-20 MED ORDER — SERTRALINE 25 MG TAB
25 mg | ORAL_TABLET | ORAL | 0 refills | Status: DC
Start: 2015-08-20 — End: 2015-08-21

## 2015-08-23 MED ORDER — SERTRALINE 25 MG TAB
25 mg | ORAL_TABLET | ORAL | 0 refills | Status: DC
Start: 2015-08-23 — End: 2015-10-22

## 2015-10-22 MED ORDER — SERTRALINE 25 MG TAB
25 mg | ORAL_TABLET | Freq: Every day | ORAL | 0 refills | Status: DC
Start: 2015-10-22 — End: 2016-01-17

## 2015-10-22 NOTE — Telephone Encounter (Signed)
From: [name]  To: Murlean Caller, MD  Sent: 10/21/2015 11:00 PM EST  Subject:  Medication Renewal Request    Original  authorizing provider: Murlean Caller, MD    [name] would like a refill of the following medications:  sertraline  (ZOLOFT) 25 mg tablet [place] Muta Yaritza Leist, MD]    Preferred  pharmacy: [id number] - [place], [place] - [address]    Comment:

## 2015-12-02 ENCOUNTER — Encounter: Attending: Internal Medicine | Primary: Internal Medicine

## 2016-01-17 MED ORDER — SERTRALINE 25 MG TAB
25 mg | ORAL_TABLET | ORAL | 0 refills | Status: DC
Start: 2016-01-17 — End: 2016-04-07

## 2016-01-17 NOTE — Telephone Encounter (Signed)
Needs appointment before any further refills.

## 2016-04-07 MED ORDER — SERTRALINE 100 MG TAB
100 mg | ORAL_TABLET | Freq: Every day | ORAL | 0 refills | Status: DC
Start: 2016-04-07 — End: 2016-07-04

## 2016-04-07 NOTE — Telephone Encounter (Signed)
From: [name]  To: Murlean Caller, MD  Sent: 04/07/2016 8:18 AM EDT  Subject:  Prescription Question    Hi  Dr. Ephriam Knuckles,     I  have been in [place] for 3 weeks. My dad had a ruptured Abdominal Aortic Aneurysm on July 10th. He survived the surgery which the likelihood of that was rare. They had me say goodbye on my flight out here.     To  say the least I've been an anxious stressed out emotional mess trying to deal with everything. I'm his proxy and POA.     Long  story short. I'm still here and he is back at [facility] and [facility] with an infection.     I  have run out of my Zoloft as if today and there is no time in site I'm going home soon.     Is  there any chance you can send a refill to the [facility] [place]? Also I am not sleeping and extremely stressed out and non stop crying. Would increasing it help or adding xanax to calm me down?    Please  call if you need to talk to me.     [facility]  [address]   [place]  [place] [address]     Thank  you,    [name]

## 2016-04-28 ENCOUNTER — Encounter

## 2016-05-16 ENCOUNTER — Encounter

## 2016-06-05 ENCOUNTER — Ambulatory Visit
Admit: 2016-06-05 | Discharge: 2016-06-05 | Payer: PRIVATE HEALTH INSURANCE | Attending: Internal Medicine | Primary: Internal Medicine

## 2016-06-05 DIAGNOSIS — Z0000 Encounter for general adult medical examination without abnormal findings: Secondary | ICD-10-CM

## 2016-06-05 MED ORDER — BUPROPION XL 150 MG 24 HR TAB
150 mg | ORAL_TABLET | ORAL | 2 refills | Status: DC
Start: 2016-06-05 — End: 2017-06-29

## 2016-06-05 NOTE — Patient Instructions (Signed)
Vaccine Information Statement    Influenza (Flu) Vaccine (Inactivated or Recombinant): What you need to know    Many Vaccine Information Statements are available in Spanish and other languages. See www.immunize.org/vis  Hojas de Informaci??n Sobre Vacunas est??n disponibles en Espa??ol y en muchos otros idiomas. Visite www.immunize.org/vis    1. Why get vaccinated?    Influenza (???flu???) is a contagious disease that spreads around the [place] every year, usually between October and May.     Flu is caused by influenza viruses, and is spread mainly by coughing, sneezing, and close contact.     Anyone can get flu. Flu strikes suddenly and can last several days. Symptoms vary by age, but can include:  ??? fever/chills  ??? sore throat  ??? muscle aches  ??? fatigue  ??? cough  ??? headache   ??? runny or stuffy nose    Flu can also lead to pneumonia and blood infections, and cause diarrhea and seizures in children.  If you have a medical condition, such as heart or lung disease, flu can make it worse.    Flu is more dangerous for some people. Infants and young children, people 65 years of age and older, pregnant women, and people with certain health conditions or a weakened immune system are at greatest risk.      Each year thousands of people in the [place] die from flu, and many more are hospitalized.     Flu vaccine can:  ??? keep you from getting flu,  ??? make flu less severe if you do get it, and  ??? keep you from spreading flu to your family and other people.     2. Inactivated and recombinant flu vaccines    A dose of flu vaccine is recommended every flu season. Children 6 months through 8 years of age may need two doses during the same flu season.  Everyone else needs only one dose each flu season.       Some inactivated flu vaccines contain a very small amount of a mercury-based preservative called thimerosal. Studies have not shown thimerosal in vaccines to be harmful, but flu vaccines that do not contain  thimerosal are available.    There is no live flu virus in flu shots.  They cannot cause the flu.     There are many flu viruses, and they are always changing. Each year a new flu vaccine is made to protect against three or four viruses that are likely to cause disease in the upcoming flu season. But even when the vaccine doesn???t exactly match these viruses, it may still provide some protection    Flu vaccine cannot prevent:  ??? flu that is caused by a virus not covered by the vaccine, or  ??? illnesses that look like flu but are not.    It takes about 2 weeks for protection to develop after vaccination, and protection lasts through the flu season.     3. Some people should not get this vaccine    Tell the person who is giving you the vaccine:    ??? If you have any severe, life-threatening allergies.    If you ever had a life-threatening allergic reaction after a dose of flu vaccine, or have a severe allergy to any part of this vaccine, you may be advised not to get vaccinated.  Most, but not all, types of flu vaccine contain a small amount of egg protein.       ??? If you   ever had Guillain-Barr?? Syndrome (also called GBS).   Some people with a history of GBS should not get this vaccine. This should be discussed with your doctor.    ??? If you are not feeling well.    It is usually okay to get flu vaccine when you have a mild illness, but you might be asked to come back when you feel better.      4. Risks of a vaccine reaction    With any medicine, including vaccines, there is a chance of reactions. These are usually mild and go away on their own, but serious reactions are also possible.     Most people who get a flu shot do not have any problems with it.     Minor problems following a flu shot include:   ??? soreness, redness, or swelling where the shot was given    ??? hoarseness  ??? sore, red or itchy eyes  ??? cough  ??? fever  ??? aches  ??? headache  ??? itching  ??? fatigue   If these problems occur, they usually begin soon after the shot and last 1 or 2 days.     More serious problems following a flu shot can include the following:    ??? There may be a small increased risk of Guillain-Barr?? Syndrome (GBS) after inactivated flu vaccine.  This risk has been estimated at 1 or 2 additional cases per million people vaccinated. This is much lower than the risk of severe complications from flu, which can be prevented by flu vaccine.      ??? Young children who get the flu shot along with pneumococcal vaccine (PCV13) and/or DTaP vaccine at the same time might be slightly more likely to have a seizure caused by fever. Ask your doctor for more information. Tell your doctor if a child who is getting flu vaccine has ever had a seizure.     Problems that could happen after any injected vaccine:     ??? People sometimes faint after a medical procedure, including vaccination. Sitting or lying down for about 15 minutes can help prevent fainting, and injuries caused by a fall. Tell your doctor if you feel dizzy, or have vision changes or ringing in the ears.    ??? Some people get severe pain in the shoulder and have difficulty moving the arm where a shot was given. This happens very rarely.    ??? Any medication can cause a severe allergic reaction. Such reactions from a vaccine are very rare, estimated at about 1 in a million doses, and would happen within a few minutes to a few hours after the vaccination.    As with any medicine, there is a very remote chance of a vaccine causing a serious injury or death.    The safety of vaccines is always being monitored. For more information, visit: www.cdc.gov/vaccinesafety/    5. What if there is a serious reaction?    What should I look for?    ??? Look for anything that concerns you, such as signs of a severe allergic reaction, very high fever, or unusual behavior.    Signs of a severe allergic reaction can include hives, swelling of the  face and throat, difficulty breathing, a fast heartbeat, dizziness, and weakness ??? usually within a few minutes to a few hours after the vaccination.    What should I do?    ??? If you think it is a severe allergic reaction or other emergency that   can???t wait, call 9-1-1 and get the person to the nearest hospital. Otherwise, call your doctor.    ??? Reactions should be reported to the Vaccine Adverse Event Reporting System (VAERS). Your doctor should file this report, or you can do it yourself through  the VAERS web site at www.vaers.hhs.gov, or by calling 1-800-822-7967.    VAERS does not give medical advice.    6. The National Vaccine Injury Compensation Program    The National Vaccine Injury Compensation Program (VICP) is a federal program that was created to compensate people who may have been injured by certain vaccines.    Persons who believe they may have been injured by a vaccine can learn about the program and about filing a claim by calling 1-800-338-2382 or visiting the VICP website at www.hrsa.gov/vaccinecompensation.  There is a time limit to file a claim for compensation.    7. How can I learn more?  ??? Ask your healthcare provider. He or she can give you the vaccine package insert or suggest other sources of information.  ??? Call your local or state health department.  ??? Contact the Centers for Disease Control and Prevention (CDC):  - Call 1-800-232-4636 (1-800-CDC-INFO) or  - Visit CDC???s website at www.cdc.gov/flu    Vaccine Information Statement   Inactivated Influenza Vaccine   04/17/2014  42 U[place] ?? [id number]    Department of Health and Human Services  Centers for Disease Control and Prevention    Office Use Only
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
## 2016-06-19 ENCOUNTER — Encounter: Attending: Internal Medicine | Primary: Internal Medicine

## 2016-07-04 MED ORDER — SERTRALINE 100 MG TAB
100 mg | ORAL_TABLET | ORAL | 0 refills | Status: DC
Start: 2016-07-04 — End: 2016-10-11

## 2016-10-11 MED ORDER — SERTRALINE 100 MG TAB
100 mg | ORAL_TABLET | ORAL | 0 refills | Status: DC
Start: 2016-10-11 — End: 2016-11-16

## 2016-10-11 NOTE — Telephone Encounter (Signed)
Needs appointment before any further refills.

## 2016-11-16 MED ORDER — SERTRALINE 50 MG TAB
50 mg | ORAL_TABLET | Freq: Every day | ORAL | 0 refills | Status: DC
Start: 2016-11-16 — End: 2017-02-16

## 2016-11-16 MED ORDER — SERTRALINE 100 MG TAB
100 mg | ORAL_TABLET | Freq: Every day | ORAL | 0 refills | Status: DC
Start: 2016-11-16 — End: 2017-02-16
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
## 2016-11-16 NOTE — Telephone Encounter (Signed)
Glad you are both doing well. Please make an appointment for foloow up some time before the 90 day supply runs out.  DD

## 2017-02-16 MED ORDER — SERTRALINE 100 MG TAB
100 mg | ORAL_TABLET | ORAL | 0 refills | Status: DC
Start: 2017-02-16 — End: 2017-05-22

## 2017-02-16 MED ORDER — SERTRALINE 50 MG TAB
50 mg | ORAL_TABLET | ORAL | 0 refills | Status: DC
Start: 2017-02-16 — End: 2017-05-22

## 2017-04-03 ENCOUNTER — Encounter

## 2017-04-03 NOTE — Telephone Encounter (Signed)
From: Carolynne EdouardAlycia Lentsch  To: [name], MD  Sent: 04/03/2017 11:13 AM EDT  Subject:  Non-Urgent Medical Question    Hi  Dr. Ephriam Knuckleseschino!    I  hope this email finds you well! So I have my physical scheduled on Sept 28th with you.     I  was wondering if the staff could mail my blood-work form sooner than later to my [place] address since I will be traveling a ton and want to make sure I get it done for you.     Address:    [address]  [place]  [place] [phone]    Also,  I was wondering if you could add to blood test for any food sensitivity blood work to the form? I have been trying to figure out what has been causing me to bloat and have major gas.     I  haven't been able to figure out the culprit. Any chance we can test for what could be causing this? I was thinking it could be dairy, whey, pea Protein or gluten. I just can't figure it out. [name] says I have really bad breath too...so I don't know if I have a bacteria in my tummy?     Anyway  let me know your thoughts. I was just trying to get ahead of myself and get all blood work needed so we can talk when I see you.     [name]  says hi! Lots of medical things going on with him but hopefully he will be better soon.     Thanks!    [name].

## 2017-05-03 LAB — FOOD ALLERGY PROFILE
ALMOND (F20): <0.1 kU/L (ref <0.10)
CLASS: 0
CLASS: 0
CLASS: 0
CLASS: 0
CLASS: 0
CLASS: 0
CLASS: 0
CLASS: 0
CLASS: 0
CLASS: 0
CLASS: 0
CLASS: 0
CLASS: 0
CLASS: 0
CLASS: 0
COW'S MILK: <0.1 kU/L (ref <0.10)
Cashew Nut: <0.1 kU/L (ref <0.10)
Egg white: <0.1 kU/L (ref <0.10)
FISH (COD): <0.1 kU/L (ref <0.10)
Hazelnut (Filbert): <0.1 kU/L (ref <0.10)
PEANUT: <0.1 kU/L (ref <0.10)
SALMON (F41): <0.1 kU/L (ref <0.10)
SHRIMP: <0.1 kU/L (ref <0.10)
SOY: <0.1 kU/L (ref <0.10)
Scallops: <0.1 kU/L (ref <0.10)
Sesame seeds: <0.1 kU/L (ref <0.10)
TUNA (F40): <0.1 kU/L (ref <0.10)
WALNUT: <0.1 kU/L (ref <0.10)
WHEAT: <0.1 kU/L (ref <0.10)

## 2017-05-03 LAB — ALLERGEN SPECIFIC REF. RANGE

## 2017-05-04 LAB — T4 (THYROXINE): T4, Total: 6.9 ug/dL (ref 5.1–11.9)

## 2017-05-04 LAB — METABOLIC PANEL, COMPREHENSIVE
ALB/GLOBRATIO: 1.7 (calc) (ref 1.0–2.5)
ALT (SGPT): 9 U/L (ref 6–29)
AST (SGOT): 12 U/L (ref 10–30)
Albumin: 4.3 g/dL (ref 3.6–5.1)
Alk. phosphatase: 47 U/L (ref 33–115)
BUN: 16 mg/dL (ref 7–25)
Bilirubin, total: 1.8 mg/dL — ABNORMAL HIGH (ref 0.2–1.2)
CO2: 28 mmol/L (ref 20–32)
Calcium: 9.2 mg/dL (ref 8.6–10.2)
Chloride: 105 mmol/L (ref 98–110)
Creatinine: 0.71 mg/dL (ref 0.50–1.10)
EGFR NON AFR AMERICAN: 108 mL/min/{1.73_m2} (ref >=60)
GFR est AA: 125 mL/min/{1.73_m2} (ref >=60)
Globulin: 2.5 g/dL (calc) (ref 1.9–3.7)
Glucose: 138 mg/dL — ABNORMAL HIGH (ref 65–99)
Potassium: 4.3 mmol/L (ref 3.5–5.3)
Protein, total: 6.8 g/dL (ref 6.1–8.1)
Sodium: 141 mmol/L (ref 135–146)

## 2017-05-04 LAB — CBC WITH AUTOMATED DIFF
ABS. BASOPHILS: 29 Cells/mcL (ref 0–200)
ABS. EOSINOPHILS: 576 Cells/mcL — ABNORMAL HIGH (ref 15–500)
ABS. LYMPHOCYTES: 1419 Cells/mcL (ref 850–3900)
ABS. MONOCYTES: 342 Cells/mcL (ref 200–950)
ABS. NEUTROPHILS: 3335 Cells/mcL (ref 1500–7800)
BASOPHILS: 0.5 % (ref 0–2)
EOSINOPHILS: 10.1 % (ref 0–8)
HCT: 41.3 % (ref 35.0–45.0)
HGB: 13.9 g/dL (ref 11.7–15.5)
LYMPHOCYTES: 24.9 % (ref 15–49)
MCH: 32.3 pg (ref 27.0–33.0)
MCHC: 33.6 g/dL (ref 32.0–36.0)
MCV: 96.2 fL (ref 80.0–100.0)
MEAN PLATELET VOLUME: 9.7 fL (ref 7.5–12.5)
MONOCYTES: 6 % (ref 0–13)
Neutrophils: 58.5 % (ref 38–80)
PLATELET: 237 10*3/uL (ref 140–400)
RBC: 4.3 10*6/uL (ref 3.80–5.10)
RDW: 13.1 % (ref 11.0–15.0)
WBC: 5.7 10*3/uL (ref 3.8–10.8)

## 2017-05-04 LAB — LIPID PANEL
Cholesterol, total: 185 mg/dL (ref <200)
Cholesterol/HDL ratio: 3.2 calc (ref <5.0)
HDL Cholesterol: 58 mg/dL (ref >50)
LDL CHOL, CALCULATED: 108 mg/dL — ABNORMAL HIGH (ref <100)
Non-HDL Cholesterol: 127 mg/dL (calc) (ref <130)
Triglyceride: 91 mg/dL (ref <150)

## 2017-05-04 LAB — URINALYSIS W/ RFLX MICROSCOPIC
Bacteria: NONE SEEN /hpf
Bilirubin: NEGATIVE
Glucose: NEGATIVE
Hyaline cast: NONE SEEN /lpf
Ketone: NEGATIVE
Leukocyte Esterase: NEGATIVE
Nitrites: NEGATIVE
Protein: NEGATIVE
SQUAMOUS EPITHELIAL CELLS: NONE SEEN /hpf (ref <=5)
Specific gravity: 1.022 (ref 1.001–1.035)
WBC: NONE SEEN /hpf (ref <=5)
pH (UA): 5 (ref 5.0–8.0)

## 2017-05-04 LAB — VITAMIN D, 25 HYDROXY: VITAMIN D, 25-HYDROXY: 25 ng/mL — ABNORMAL LOW (ref 30–100)

## 2017-05-04 LAB — HEMOGLOBIN A1C W/O EAG: Hemoglobin A1c: 5.4 % of total Hgb (ref <5.7)

## 2017-05-04 LAB — T3 TOTAL: T3, total: 79 ng/dL (ref 76–181)

## 2017-05-04 LAB — VITAMIN B12: Vitamin B12: 363 pg/mL (ref 200–1100)

## 2017-05-04 LAB — TSH 3RD GENERATION: TSH: 1.01 mIU/L (ref 0.40–4.50)

## 2017-05-04 NOTE — Progress Notes (Signed)
b12 and vit d very low - please take 2000 iu vit D and 1000 iu B12 daily - will discuss further ather visit

## 2017-05-04 NOTE — Progress Notes (Signed)
Lvm for pt to call office back for labs result. AB

## 2017-05-22 MED ORDER — SERTRALINE 50 MG TAB
50 mg | ORAL_TABLET | ORAL | 0 refills | Status: DC
Start: 2017-05-22 — End: 2017-06-29

## 2017-05-22 MED ORDER — SERTRALINE 100 MG TAB
100 mg | ORAL_TABLET | ORAL | 0 refills | Status: DC
Start: 2017-05-22 — End: 2017-06-29

## 2017-06-08 ENCOUNTER — Encounter: Attending: Internal Medicine | Primary: Internal Medicine

## 2017-06-29 ENCOUNTER — Ambulatory Visit
Admit: 2017-06-29 | Discharge: 2017-06-29 | Payer: PRIVATE HEALTH INSURANCE | Attending: Internal Medicine | Primary: Internal Medicine

## 2017-06-29 DIAGNOSIS — Z0000 Encounter for general adult medical examination without abnormal findings: Secondary | ICD-10-CM

## 2017-06-29 MED ORDER — CYANOCOBALAMIN 1,000 MCG/ML IJ SOLN
1000 mcg/mL | Freq: Once | INTRAMUSCULAR | Status: AC
Start: 2017-06-29 — End: 2017-06-29
  Administered 2017-06-29: 17:00:00 via INTRAMUSCULAR

## 2017-06-29 MED ORDER — SERTRALINE 100 MG TAB
100 mg | ORAL_TABLET | Freq: Every day | ORAL | 1 refills | Status: DC
Start: 2017-06-29 — End: 2018-01-04

## 2017-06-29 NOTE — Patient Instructions (Signed)
Vaccine Information Statement    Influenza (Flu) Vaccine (Inactivated or Recombinant): What you need to know    Many Vaccine Information Statements are available in Spanish and other languages. See www.immunize.org/vis  Hojas de Informaci??n Sobre Vacunas est??n disponibles en Espa??ol y en muchos otros idiomas. Visite www.immunize.org/vis    1. Why get vaccinated?    Influenza (???flu???) is a contagious disease that spreads around the [place] every year, usually between October and May.     Flu is caused by influenza viruses, and is spread mainly by coughing, sneezing, and close contact.     Anyone can get flu. Flu strikes suddenly and can last several days. Symptoms vary by age, but can include:  ??? fever/chills  ??? sore throat  ??? muscle aches  ??? fatigue  ??? cough  ??? headache   ??? runny or stuffy nose    Flu can also lead to pneumonia and blood infections, and cause diarrhea and seizures in children.  If you have a medical condition, such as heart or lung disease, flu can make it worse.    Flu is more dangerous for some people. Infants and young children, people 65 years of age and older, pregnant women, and people with certain health conditions or a weakened immune system are at greatest risk.      Each year thousands of people in the [place] die from flu, and many more are hospitalized.     Flu vaccine can:  ??? keep you from getting flu,  ??? make flu less severe if you do get it, and  ??? keep you from spreading flu to your family and other people.     2. Inactivated and recombinant flu vaccines    A dose of flu vaccine is recommended every flu season. Children 6 months through 8 years of age may need two doses during the same flu season.  Everyone else needs only one dose each flu season.       Some inactivated flu vaccines contain a very small amount of a mercury-based preservative called thimerosal. Studies have not shown thimerosal in vaccines to be harmful, but flu vaccines that do not contain  thimerosal are available.    There is no live flu virus in flu shots.  They cannot cause the flu.     There are many flu viruses, and they are always changing. Each year a new flu vaccine is made to protect against three or four viruses that are likely to cause disease in the upcoming flu season. But even when the vaccine doesn???t exactly match these viruses, it may still provide some protection    Flu vaccine cannot prevent:  ??? flu that is caused by a virus not covered by the vaccine, or  ??? illnesses that look like flu but are not.    It takes about 2 weeks for protection to develop after vaccination, and protection lasts through the flu season.     3. Some people should not get this vaccine    Tell the person who is giving you the vaccine:    ??? If you have any severe, life-threatening allergies.    If you ever had a life-threatening allergic reaction after a dose of flu vaccine, or have a severe allergy to any part of this vaccine, you may be advised not to get vaccinated.  Most, but not all, types of flu vaccine contain a small amount of egg protein.       ??? If you   ever had Guillain-Barr?? Syndrome (also called GBS).   Some people with a history of GBS should not get this vaccine. This should be discussed with your doctor.    ??? If you are not feeling well.    It is usually okay to get flu vaccine when you have a mild illness, but you might be asked to come back when you feel better.      4. Risks of a vaccine reaction    With any medicine, including vaccines, there is a chance of reactions. These are usually mild and go away on their own, but serious reactions are also possible.     Most people who get a flu shot do not have any problems with it.     Minor problems following a flu shot include:   ??? soreness, redness, or swelling where the shot was given    ??? hoarseness  ??? sore, red or itchy eyes  ??? cough  ??? fever  ??? aches  ??? headache  ??? itching  ??? fatigue   If these problems occur, they usually begin soon after the shot and last 1 or 2 days.     More serious problems following a flu shot can include the following:    ??? There may be a small increased risk of Guillain-Barr?? Syndrome (GBS) after inactivated flu vaccine.  This risk has been estimated at 1 or 2 additional cases per million people vaccinated. This is much lower than the risk of severe complications from flu, which can be prevented by flu vaccine.      ??? Young children who get the flu shot along with pneumococcal vaccine (PCV13) and/or DTaP vaccine at the same time might be slightly more likely to have a seizure caused by fever. Ask your doctor for more information. Tell your doctor if a child who is getting flu vaccine has ever had a seizure.     Problems that could happen after any injected vaccine:     ??? People sometimes faint after a medical procedure, including vaccination. Sitting or lying down for about 15 minutes can help prevent fainting, and injuries caused by a fall. Tell your doctor if you feel dizzy, or have vision changes or ringing in the ears.    ??? Some people get severe pain in the shoulder and have difficulty moving the arm where a shot was given. This happens very rarely.    ??? Any medication can cause a severe allergic reaction. Such reactions from a vaccine are very rare, estimated at about 1 in a million doses, and would happen within a few minutes to a few hours after the vaccination.    As with any medicine, there is a very remote chance of a vaccine causing a serious injury or death.    The safety of vaccines is always being monitored. For more information, visit: www.cdc.gov/vaccinesafety/    5. What if there is a serious reaction?    What should I look for?    ??? Look for anything that concerns you, such as signs of a severe allergic reaction, very high fever, or unusual behavior.    Signs of a severe allergic reaction can include hives, swelling of the  face and throat, difficulty breathing, a fast heartbeat, dizziness, and weakness ??? usually within a few minutes to a few hours after the vaccination.    What should I do?    ??? If you think it is a severe allergic reaction or other emergency that   can???t wait, call 9-1-1 and get the person to the nearest hospital. Otherwise, call your doctor.    ??? Reactions should be reported to the Vaccine Adverse Event Reporting System (VAERS). Your doctor should file this report, or you can do it yourself through  the VAERS web site at www.vaers.hhs.gov, or by calling 1-800-822-7967.    VAERS does not give medical advice.    6. The National Vaccine Injury Compensation Program    The National Vaccine Injury Compensation Program (VICP) is a federal program that was created to compensate people who may have been injured by certain vaccines.    Persons who believe they may have been injured by a vaccine can learn about the program and about filing a claim by calling 1-800-338-2382 or visiting the VICP website at www.hrsa.gov/vaccinecompensation.  There is a time limit to file a claim for compensation.    7. How can I learn more?  ??? Ask your healthcare provider. He or she can give you the vaccine package insert or suggest other sources of information.  ??? Call your local or state health department.  ??? Contact the Centers for Disease Control and Prevention (CDC):  - Call 1-800-232-4636 (1-800-CDC-INFO) or  - Visit CDC???s website at www.cdc.gov/flu    Vaccine Information Statement   Inactivated Influenza Vaccine   04/17/2014  42 U[place] ?? [id number]    Department of Health and Human Services  Centers for Disease Control and Prevention    Office Use Only
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
## 2018-01-04 ENCOUNTER — Encounter

## 2018-01-04 MED ORDER — SERTRALINE 100 MG TAB
100 mg | ORAL_TABLET | ORAL | 0 refills | Status: DC
Start: 2018-01-04 — End: 2018-04-04

## 2018-01-14 MED ORDER — CYANOCOBALAMIN 1,000 MCG/ML IJ SOLN
1000 mcg/mL | INTRAMUSCULAR | 0 refills | Status: DC
Start: 2018-01-14 — End: 2019-04-10

## 2018-01-14 MED ORDER — INSULIN SYRINGE U-100 WITH NEEDLE 1 ML 31 GAUGE X 5/16"
1 mL 3 gauge x 5/6 | INJECTION | 0 refills | Status: DC
Start: 2018-01-14 — End: 2018-04-04

## 2018-01-14 NOTE — Telephone Encounter (Signed)
From: Jennifer Campos  To: Shafter Jupin, MD  Sent: 01/11/2018 4:53 PM EDT  Subject: Non-Urgent Medical Question    Hi- Any chance we can move forward on a prescription for the monthly vitamin b12 shot? I feel comfortable giving myself the shot monthly.     I know we talked about it my last visit but you told me to let you know when I decided.     If you can send it and you approve it [facility] on [address]     Let me know.     Thanks! Have a good weekend!

## 2018-01-14 NOTE — Telephone Encounter (Signed)
Sent b12 inj     As per Dr. Ephriam Knuckles

## 2018-02-21 MED ORDER — SYRINGE WITH NEEDLE 3 ML 25 X 5/8"
3 mL 25 x 5/8" | INJECTION | 0 refills | Status: AC
Start: 2018-02-21 — End: ?

## 2018-04-04 ENCOUNTER — Encounter

## 2018-04-05 MED ORDER — INSULIN SYRINGE U-100 WITH NEEDLE 1 ML 31 GAUGE X 5/16"
1 mL 3 gauge x 5/6 | INJECTION | 0 refills | Status: AC
Start: 2018-04-05 — End: ?

## 2018-04-05 MED ORDER — SERTRALINE 100 MG TAB
100 mg | ORAL_TABLET | ORAL | 0 refills | Status: DC
Start: 2018-04-05 — End: 2018-07-09

## 2018-05-09 ENCOUNTER — Encounter

## 2018-07-04 ENCOUNTER — Encounter: Attending: Internal Medicine | Primary: Internal Medicine

## 2018-07-08 ENCOUNTER — Ambulatory Visit
Admit: 2018-07-08 | Discharge: 2018-07-08 | Payer: PRIVATE HEALTH INSURANCE | Attending: Internal Medicine | Primary: Internal Medicine

## 2018-07-08 ENCOUNTER — Ambulatory Visit: Attending: Internal Medicine | Primary: Internal Medicine

## 2018-07-08 DIAGNOSIS — Z0000 Encounter for general adult medical examination without abnormal findings: Secondary | ICD-10-CM

## 2018-07-08 MED ORDER — ERGOCALCIFEROL (VITAMIN D2) 50,000 UNIT CAP
1250 mcg (50,000 unit) | ORAL_CAPSULE | ORAL | 0 refills | Status: DC
Start: 2018-07-08 — End: 2018-07-31

## 2018-07-08 NOTE — Progress Notes (Signed)
Chief Complaint:  37 y.o. WHITE OR CAUCASIAN female presents for CPE  HPI:  Stressed re dealing with father  Had pap in [place]   Exercising regularly  ROS: Denies CP, SOB, abdominal pain, dysuria, HA, fever, chills, rash      Allergies   Allergen Reactions   ??? Macrobid [Nitrofurantoin Monohyd/M-Cryst] Shortness of Breath     Current Outpatient Medications   Medication Sig Dispense Refill   ??? ergocalciferol (DRISDOL) 50,000 unit capsule Take 1 Cap by mouth every seven (7) days. 4 Cap 0   ??? sertraline (ZOLOFT) 100 mg tablet TAKE 1 TAB BY MOUTH DAILY. (Patient taking differently: Take 200 mg by mouth daily.) 90 Tab 0   ??? Insulin Syringe-Needle U-100 (BD INSULIN SYRINGE ULTRA-FINE) 1 mL 31 gauge x 5/16 syrg USE FOR B12 INJECTIONS 12 Syringe 0   ??? Syringe with Needle, Disp, (BD SAFETYGLIDE SYRINGE) 3 mL 25 x 5/8" syrg USE AS DIRECTED 12 Syringe 0   ??? cyanocobalamin (VITAMIN B12) 1,000 mcg/mL injection 1 mL by IntraMUSCular route every thirty (30) days. 30 mL 0   ??? ibuprofen (MOTRIN) 600 mg tablet TAKE 1 TABLET BY MOUTH EVERY 6 HOURS WITH FOOD OR MILK AS NEEDED  2   ??? cholecalciferol (VITAMIN D3) 1,000 unit cap Take  by mouth daily.     ??? Glucosamine HCl-MSM (GLUCOSAMINE MSM) 1,500-500 mg/30 mL liqd Take  by mouth.     ??? fluticasone (FLONASE) 50 mcg/actuation nasal spray 2 Sprays by Both Nostrils route daily. As needed     ??? loratadine (CLARITIN) 10 mg tablet Take 1 Tab by mouth daily. 30 Tab 0   ??? PRENAT VIT COMB.10/IRON/FA/DHA (CAVAN-FOLATE DHA PO) Take  by mouth.     ??? omega-3 fatty acids-vitamin e (FISH OIL) 1,000 mg cap Take 1 Cap by mouth.       Past Medical History:   Diagnosis Date   ??? B12 deficiency 01/14/2013   ??? Chronic pain 01/14/2013    R rib, T11 area   ??? Multiple thyroid nodules 01/14/2013    Stable on sono 11/09/11   ??? 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 Seasonal allergic rhinitis 01/14/2013   ??? Vitamin D deficiency 01/14/2013     History reviewed. No pertinent surgical  history.  Family History   Problem Relation Age of Onset   ??? Diabetes Father    ??? Diabetes Paternal Aunt      Social History     Socioeconomic History   ??? Marital status: MARRIED     Spouse name: fiance Morgane Joerger   ??? Number of children: 0   ??? Years of education: Not on file   ??? Highest education level: Not on file   Occupational History   ??? Occupation: Financial controller   Social Needs   ??? Financial resource strain: Not hard at all   ??? Food insecurity:     Worry: Never true     Inability: Never true   ??? Transportation needs:     Medical: No     Non-medical: No   Tobacco Use   ??? Smoking status: Never Smoker   ??? Smokeless tobacco: Never Used   Substance and Sexual Activity   ??? Alcohol use: Yes     Frequency: 2-4 times a month     Drinks per session: 1 or 2     Binge frequency: Never     Comment: 1 drink/week   ??? Drug use: No   ???  Sexual activity: Yes     Partners: Male   Lifestyle   ??? Physical activity:     Days per week: Not on file     Minutes per session: Not on file   ??? Stress: Not on file   Relationships   ??? Social connections:     Talks on phone: Not on file     Gets together: Not on file     Attends religious service: Not on file     Active member of club or organization: Not on file     Attends meetings of clubs or organizations: Not on file     Relationship status: Not on file   ??? Intimate partner violence:     Fear of current or ex partner: Not on file     Emotionally abused: Not on file     Physically abused: Not on file     Forced sexual activity: Not on file   Other Topics Concern   ??? Not on file   Social History Narrative   ??? Not on file       3 most recent PHQ Screens 07/08/2018   Little interest or pleasure in doing things Not at all   Feeling down, depressed, irritable, or hopeless Not at all   Total Score PHQ 2 0   Trouble falling or staying asleep, or sleeping too much Not at all   Feeling tired or having little energy Not at all   Poor appetite, weight loss, or overeating Not at all   Feeling bad  about yourself - or that you are a failure or have let yourself or your family down Not at all   Trouble concentrating on things such as school, work, reading, or watching TV Not at all   Moving or speaking so slowly that other people could have noticed; or the opposite being so fidgety that others notice Not at all   Thoughts of being better off dead, or hurting yourself in some way Not at all   PHQ 9 Score 0   How difficult have these problems made it for you to do your work, take care of your home and get along with others Not difficult at all       Visit Vitals  BP 110/70   Pulse 82   Temp 98 ??F (36.7 ??C) (Oral)   Ht 5' 5.5" (1.664 m)   Wt 169 lb (76.7 kg)   SpO2 99%   BMI 27.70 kg/m??       PHYSICAL EXAM:  General appearance  [x] WDWN  [x] NAD      Eyes  [x] Perrl  [x] EOMI  [x] Conjunct and lids     ENT  [x] Ext NL  [] Oral Cav NL  [x] No hearing loss  [] Intern NL  [] Lips teeth gums NL   Neck  [x] NL Appearance  [x] Thyroid NL      Respiratory  [x] BS WNL  [] Chest percussion WNL  [x] NL resp excursion  [] Chest palpation WNL    CVS  [x] RRR  [] No Bruits  [] No edema/varicos  [x] No murmur  [] Fem/Carotid pulse WNL     [] 2+Bilat DP and PT pulses  [] NL palpation      Chest/Breast  [] Gen apppear WNL  [] No masses or discharge      GI  [x] No HSM  [] No rectal mass  [x] Soft, Non-tender  [x] No hernia    GU-Female  [] NL ext genitalia  [] Prostate WNL  [] As per Uro     GU- Female  [] NL ext  genitalia and vagina  [] External exam NL  [x] as per GYN     Lymph  [x] Neg neck nodes   [] Neg groin nodes  [x] Neg supraclav nodes  [] Neg axillae nodes    Skin  [x] No rash, lesions, or ulcers  [x] Warm and dry      Neuro  [x] Reflexes WNL  [] Sensory WNL   [] CN 2-12 WNL     Psych  [x] Alert  [x] Oriented x 3  [x] Memory intact     Mus  [x]  Power/strength 5/5 bilat  [x] Joints WNL  [] No scoliosis  [] Gait WNL  [] No spinal tenderness     [x] No rigidity tremor  [x] No kyphosis  [x] No C/C/E        Labs 07/02/18 reviewed - HbA1c 5.6, LDL 130 vit D 15    Results for  orders placed or performed in visit on 07/08/18   AMB POC EKG ROUTINE W/ 12 LEADS, INTER & REP    Narrative    Sinus  Rhythm   -RSR(V1) -nondiagnostic.   no old EKG for comparison   NORMAL       Health Maintenance   Topic Date Due   ??? PAP AKA CERVICAL CYTOLOGY  01/08/2016   ??? Influenza Age 14 to Adult  04/11/2018   ??? DTaP/Tdap/Td series (2 - Td) 01/15/2023   ??? Pneumococcal 0-64 years  Aged Out       Assessment/Plan:  Diagnoses and all orders for this visit:    1. Routine adult health maintenance  -     AMB POC EKG ROUTINE W/ 12 LEADS, INTER & REP    2. Vitamin D deficiency - take 1000 iu daily and 50,000 1 x week x 4 weeks   -     ergocalciferol (DRISDOL) 50,000 unit capsule; Take 1 Cap by mouth every seven (7) days.    3. B12 deficiency - 1000 mcg 1 x month    4. Multiple thyroid nodules - TFT's wnl    5. Recurrent major depressive disorder, in partial remission (HCC) - Stable - continue current medications at same dosages.      6. Encounter for immunization  -     INFLUENZA VIRUS VAC QUAD,SPLIT,PRESV FREE SYRINGE IM    BMI 27 -   I have reviewed/discussed the above normal BMI with the patient.  I have recommended the following interventions: dietary management education, guidance, and counseling, encourage exercise and monitor weight .       Medications Discontinued During This Encounter   Medication Reason   ??? cyanocobalamin (VITAMIN B-12) 500 mcg tablet Not A Current Medication         Total time spent with patient:  40 minutes   []  The total amount of time spent face to face with this patient was 40  minutes and  >50% of that time was spent in counseling and coordination of care on above issues.     Care Plan discussed with:   [x] Patient  [] Family  [] Care Manager  [] Nursing  [] Consultant/Specialist              Discussed:   [] Advanced Directives   [x]  diet  [x] excercise   [] seat belts    []  smoking cessation  [] safe sex practices   [] sun screen usage  [x] weight loss recommended    [] referred to diabetes educator    [] carbohydrate counting reviewed   [] blood sugar goals reviewed  [x] complications of diabetes reviewed    []   []          Enslee Bibbins  Fransisca Shawn, MD   __________________________________________________

## 2018-07-08 NOTE — Progress Notes (Signed)
Chief Complaint:  37 y.o. WHITE OR CAUCASIAN female presents for CPE  HPI:  Stressed re dealing with father  Had pap in [place]   Exercising regularly  ROS: Denies CP, SOB, abdominal pain, dysuria, HA, fever, chills, rash      Allergies   Allergen Reactions   ??? Macrobid [Nitrofurantoin Monohyd/M-Cryst] Shortness of Breath     Current Outpatient Medications   Medication Sig Dispense Refill   ??? ergocalciferol (DRISDOL) 50,000 unit capsule Take 1 Cap by mouth every seven (7) days. 4 Cap 0   ??? sertraline (ZOLOFT) 100 mg tablet TAKE 1 TAB BY MOUTH DAILY. (Patient taking differently: Take 200 mg by mouth daily.) 90 Tab 0   ??? Insulin Syringe-Needle U-100 (BD INSULIN SYRINGE ULTRA-FINE) 1 mL 31 gauge x 5/16 syrg USE FOR B12 INJECTIONS 12 Syringe 0   ??? Syringe with Needle, Disp, (BD SAFETYGLIDE SYRINGE) 3 mL 25 x 5/8" syrg USE AS DIRECTED 12 Syringe 0   ??? cyanocobalamin (VITAMIN B12) 1,000 mcg/mL injection 1 mL by IntraMUSCular route every thirty (30) days. 30 mL 0   ??? ibuprofen (MOTRIN) 600 mg tablet TAKE 1 TABLET BY MOUTH EVERY 6 HOURS WITH FOOD OR MILK AS NEEDED  2   ??? cholecalciferol (VITAMIN D3) 1,000 unit cap Take  by mouth daily.     ??? Glucosamine HCl-MSM (GLUCOSAMINE MSM) 1,500-500 mg/30 mL liqd Take  by mouth.     ??? fluticasone (FLONASE) 50 mcg/actuation nasal spray 2 Sprays by Both Nostrils route daily. As needed     ??? loratadine (CLARITIN) 10 mg tablet Take 1 Tab by mouth daily. 30 Tab 0   ??? PRENAT VIT COMB.10/IRON/FA/DHA (CAVAN-FOLATE DHA PO) Take  by mouth.     ??? omega-3 fatty acids-vitamin e (FISH OIL) 1,000 mg cap Take 1 Cap by mouth.       Past Medical History:   Diagnosis Date   ??? B12 deficiency 01/14/2013   ??? Chronic pain 01/14/2013    R rib, T11 area   ??? Multiple thyroid nodules 01/14/2013    Stable on sono 11/09/11   ??? 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 Seasonal allergic rhinitis 01/14/2013   ??? Vitamin D deficiency 01/14/2013      History reviewed. No pertinent surgical history.  Family History   Problem Relation Age of Onset   ??? Diabetes Father    ??? Diabetes Paternal Aunt      Social History     Socioeconomic History   ??? Marital status: MARRIED     Spouse name: fiance Tandi Hanko   ??? Number of children: 0   ??? Years of education: Not on file   ??? Highest education level: Not on file   Occupational History   ??? Occupation: Financial controller   Social Needs   ??? Financial resource strain: Not hard at all   ??? Food insecurity:     Worry: Never true     Inability: Never true   ??? Transportation needs:     Medical: No     Non-medical: No   Tobacco Use   ??? Smoking status: Never Smoker   ??? Smokeless tobacco: Never Used   Substance and Sexual Activity   ??? Alcohol use: Yes     Frequency: 2-4 times a month     Drinks per session: 1 or 2     Binge frequency: Never     Comment: 1 drink/week   ??? Drug use: No   ???  Sexual activity: Yes     Partners: Male   Lifestyle   ??? Physical activity:     Days per week: Not on file     Minutes per session: Not on file   ??? Stress: Not on file   Relationships   ??? Social connections:     Talks on phone: Not on file     Gets together: Not on file     Attends religious service: Not on file     Active member of club or organization: Not on file     Attends meetings of clubs or organizations: Not on file     Relationship status: Not on file   ??? Intimate partner violence:     Fear of current or ex partner: Not on file     Emotionally abused: Not on file     Physically abused: Not on file     Forced sexual activity: Not on file   Other Topics Concern   ??? Not on file   Social History Narrative   ??? Not on file       3 most recent PHQ Screens 07/08/2018   Little interest or pleasure in doing things Not at all   Feeling down, depressed, irritable, or hopeless Not at all   Total Score PHQ 2 0   Trouble falling or staying asleep, or sleeping too much Not at all   Feeling tired or having little energy Not at all    Poor appetite, weight loss, or overeating Not at all   Feeling bad about yourself - or that you are a failure or have let yourself or your family down Not at all   Trouble concentrating on things such as school, work, reading, or watching TV Not at all   Moving or speaking so slowly that other people could have noticed; or the opposite being so fidgety that others notice Not at all   Thoughts of being better off dead, or hurting yourself in some way Not at all   PHQ 9 Score 0   How difficult have these problems made it for you to do your work, take care of your home and get along with others Not difficult at all       Visit Vitals  BP 110/70   Pulse 82   Temp 98 ??F (36.7 ??C) (Oral)   Ht 5' 5.5" (1.664 m)   Wt 169 lb (76.7 kg)   SpO2 99%   BMI 27.70 kg/m??       PHYSICAL EXAM:  General appearance  [x] WDWN  [x] NAD      Eyes  [x] Perrl  [x] EOMI  [x] Conjunct and lids     ENT  [x] Ext NL  [] Oral Cav NL  [x] No hearing loss  [] Intern NL  [] Lips teeth gums NL   Neck  [x] NL Appearance  [x] Thyroid NL      Respiratory  [x] BS WNL  [] Chest percussion WNL  [x] NL resp excursion  [] Chest palpation WNL    CVS  [x] RRR  [] No Bruits  [] No edema/varicos  [x] No murmur  [] Fem/Carotid pulse WNL     [] 2+Bilat DP and PT pulses  [] NL palpation      Chest/Breast  [] Gen apppear WNL  [] No masses or discharge      GI  [x] No HSM  [] No rectal mass  [x] Soft, Non-tender  [x] No hernia    GU-Female  [] NL ext genitalia  [] Prostate WNL  [] As per Uro     GU- Female  [] NL ext  genitalia and vagina  [] External exam NL  [x] as per GYN     Lymph  [x] Neg neck nodes   [] Neg groin nodes  [x] Neg supraclav nodes  [] Neg axillae nodes    Skin  [x] No rash, lesions, or ulcers  [x] Warm and dry      Neuro  [x] Reflexes WNL  [] Sensory WNL   [] CN 2-12 WNL     Psych  [x] Alert  [x] Oriented x 3  [x] Memory intact     Mus  [x]  Power/strength 5/5 bilat  [x] Joints WNL  [] No scoliosis  [] Gait WNL  [] No spinal tenderness     [x] No rigidity tremor  [x] No kyphosis  [x] No C/C/E         Labs 07/02/18 reviewed - HbA1c 5.6, LDL 130 vit D 15    Results for orders placed or performed in visit on 07/08/18   AMB POC EKG ROUTINE W/ 12 LEADS, INTER & REP    Narrative    Sinus  Rhythm   -RSR(V1) -nondiagnostic.   no old EKG for comparison   NORMAL       Health Maintenance   Topic Date Due   ??? PAP AKA CERVICAL CYTOLOGY  01/08/2016   ??? Influenza Age 34 to Adult  04/11/2018   ??? DTaP/Tdap/Td series (2 - Td) 01/15/2023   ??? Pneumococcal 0-64 years  Aged Out       Assessment/Plan:  Diagnoses and all orders for this visit:    1. Routine adult health maintenance  -     AMB POC EKG ROUTINE W/ 12 LEADS, INTER & REP    2. Vitamin D deficiency - take 1000 iu daily and 50,000 1 x week x 4 weeks   -     ergocalciferol (DRISDOL) 50,000 unit capsule; Take 1 Cap by mouth every seven (7) days.    3. B12 deficiency - 1000 mcg 1 x month    4. Multiple thyroid nodules - TFT's wnl    5. Recurrent major depressive disorder, in partial remission (HCC) - Stable - continue current medications at same dosages.      6. Encounter for immunization  -     INFLUENZA VIRUS VAC QUAD,SPLIT,PRESV FREE SYRINGE IM    BMI 27 -   I have reviewed/discussed the above normal BMI with the patient.  I have recommended the following interventions: dietary management education, guidance, and counseling, encourage exercise and monitor weight .       Medications Discontinued During This Encounter   Medication Reason   ??? cyanocobalamin (VITAMIN B-12) 500 mcg tablet Not A Current Medication         Total time spent with patient:  40 minutes   []  The total amount of time spent face to face with this patient was 40  minutes and  >50% of that time was spent in counseling and coordination of care on above issues.     Care Plan discussed with:   [x] Patient  [] Family  [] Care Manager  [] Nursing  [] Consultant/Specialist              Discussed:   [] Advanced Directives   [x]  diet  [x] excercise   [] seat belts     []  smoking cessation  [] safe sex practices   [] sun screen usage  [x] weight loss recommended    [] referred to diabetes educator   [] carbohydrate counting reviewed   [] blood sugar goals reviewed  [x] complications of diabetes reviewed    []   []          Avry Roedl  Fransisca Shawn, MD   __________________________________________________

## 2018-07-09 ENCOUNTER — Encounter: Attending: Internal Medicine | Primary: Internal Medicine

## 2018-07-09 ENCOUNTER — Encounter

## 2018-07-09 MED ORDER — SERTRALINE 100 MG TAB
100 mg | ORAL_TABLET | Freq: Every day | ORAL | 1 refills | Status: AC
Start: 2018-07-09 — End: ?

## 2018-07-31 ENCOUNTER — Encounter

## 2018-07-31 MED ORDER — ERGOCALCIFEROL (VITAMIN D2) 50,000 UNIT CAP
1250 mcg (50,000 unit) | ORAL_CAPSULE | ORAL | 0 refills | Status: AC
Start: 2018-07-31 — End: ?

## 2018-08-26 ENCOUNTER — Encounter

## 2019-04-10 MED ORDER — CYANOCOBALAMIN 1,000 MCG/ML IJ SOLN
1000 mcg/mL | INTRAMUSCULAR | 9 refills | Status: AC
Start: 2019-04-10 — End: ?

## 2019-07-15 ENCOUNTER — Encounter: Payer: PRIVATE HEALTH INSURANCE | Attending: Internal Medicine | Primary: Internal Medicine

## 2023-01-11 IMAGING — MR MRI CERVICAL SPINE WITHOUT CONTRAST
7 of 8 series · 36 of 48 positions shown · non-contrast
Comparison: none

﻿

MAGNETIC RESONANCE IMAGING OF THE CERVICAL SPINE WITHOUT INTRAVENOUS CONTRAST ADMINISTRATION
HISTORY: Chronic neck pain.
TECHNIQUE: Multiplanar magnetic resonance imaging of the cervical spine was accomplished utilizing a surface coil and an open 0.3Fevervo Wnt scanner without intravenous contrast administration.  T1 and T2-weighted thin slice sections were obtained.

[Series 1: scano s/c · axial · 5.0mm · 1.02mm/px · z∈[-8,+130]mm · 4 of 14 slices shown]
[im 1/14]
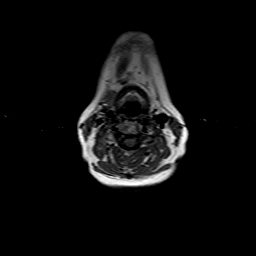
[im 5/14]
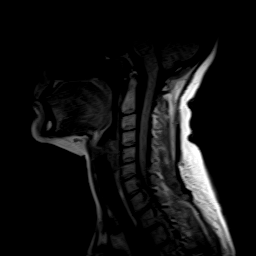
[im 9/14]
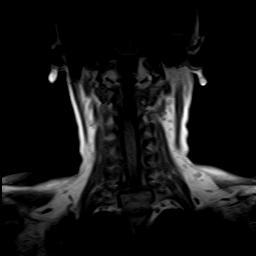
[im 14/14]
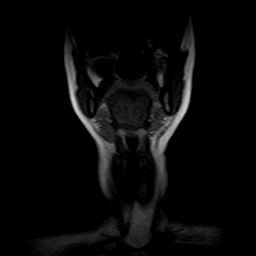

[Series 2: sag fir · sagittal · 3.0mm · 0.94mm/px · 5 of 15 slices shown]
[im 1/15]
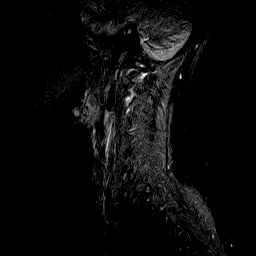
[im 4/15]
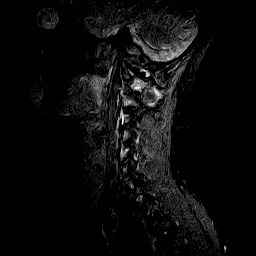
[im 8/15]
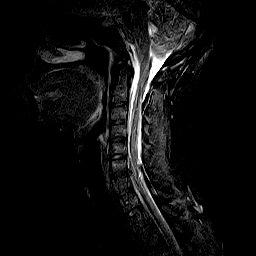
[im 11/15]
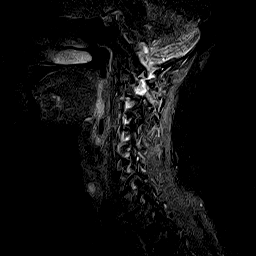
[im 15/15]
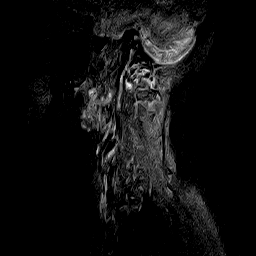

[Series 3: T1 · sagittal · 3.0mm · 0.94mm/px · 5 of 15 slices shown]
[im 1/15]
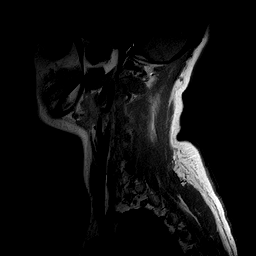
[im 4/15]
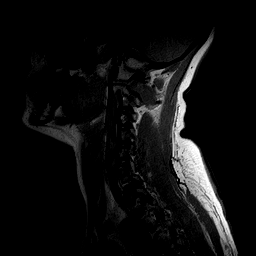
[im 8/15]
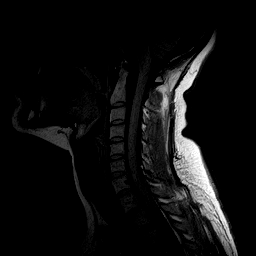
[im 11/15]
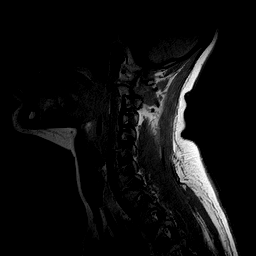
[im 15/15]
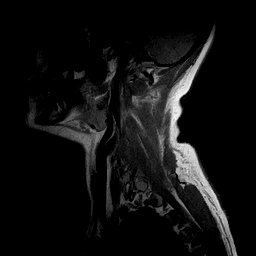

[Series 4: trs ge w/mtc · axial · 3.0mm · 0.94mm/px · z∈[-105,-7]mm · 8 of 26 slices shown]
[im 1/26]
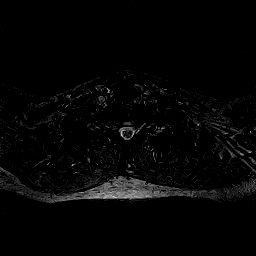
[im 4/26]
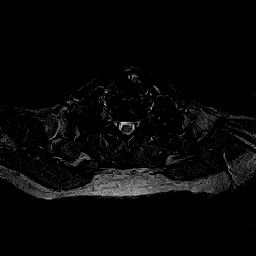
[im 8/26]
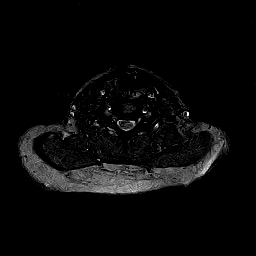
[im 11/26]
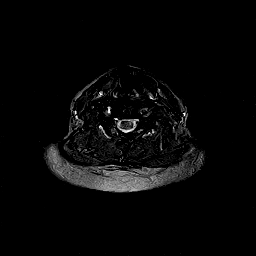
[im 15/26]
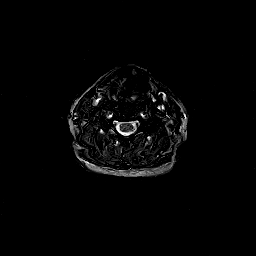
[im 18/26]
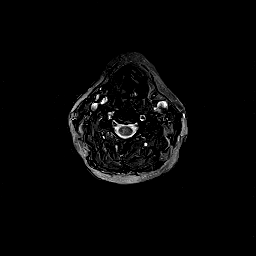
[im 22/26]
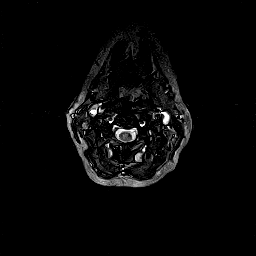
[im 26/26]
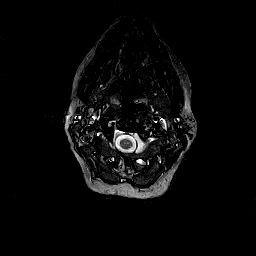

[Series 5: T2 · sagittal · 3.0mm · 0.94mm/px · 5 of 15 slices shown]
[im 1/15]
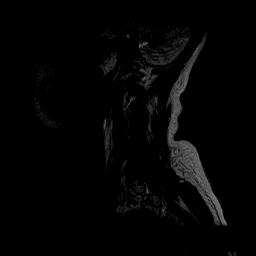
[im 4/15]
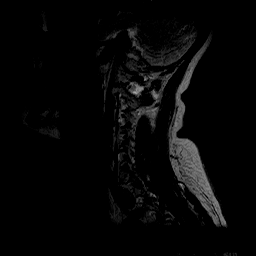
[im 8/15]
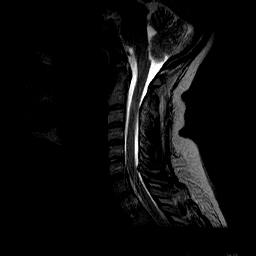
[im 11/15]
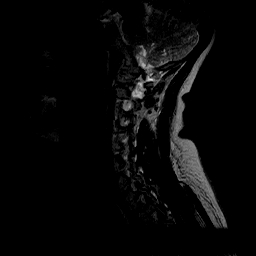
[im 15/15]
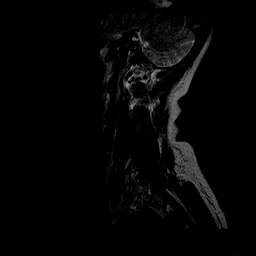

[Series 6: (id) · axial · 3.0mm · 0.94mm/px · z∈[-105,-7]mm · 8 of 26 slices shown]
[im 1/26]
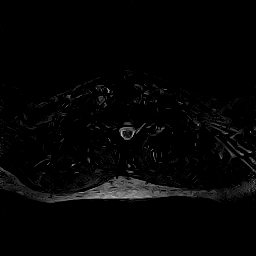
[im 4/26]
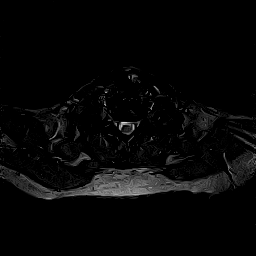
[im 8/26]
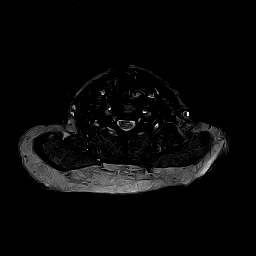
[im 11/26]
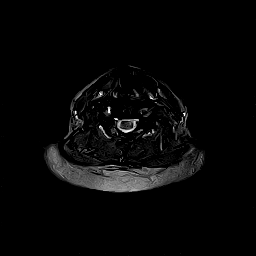
[im 15/26]
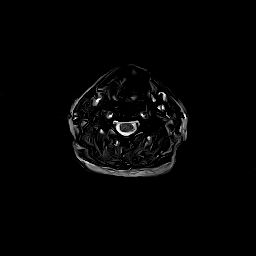
[im 18/26]
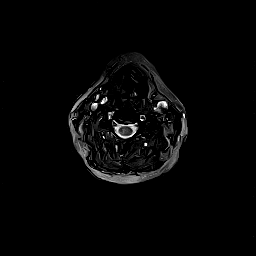
[im 22/26]
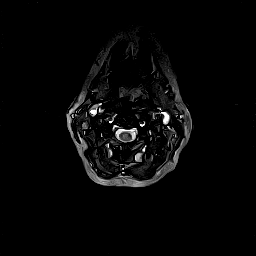
[im 26/26]
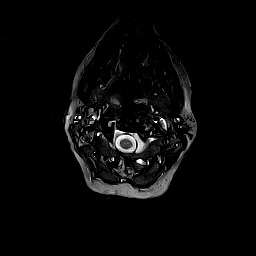

[Series 7: cor shim · coronal · 10.0mm · 4.69mm/px · 1 of 3 slices shown]
[im 1/3]
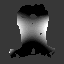

[36 of 48 positions shown; findings below may reference images not displayed]

FINDINGS: No prior studies are available for comparison purposes. 

The cervical vertebrae appear normal in height.  There are no significant abnormalities of regional bone marrow signal nor vertebral alignment.  

Cerebellar tonsils normally positioned.  No significant abnormalities of included paraspinal soft tissues.

Early cervical degenerative disc disease is present with mild disc desiccation.  There is focal prominence at the C6-C7 interspace where early spondylotic changes observed with small ventral disc and spur complex and mild degenerative annular bulging.  There is no evidence for a focal disc herniation or extrusion.  No evidence for central or lateral cervical spinal canal stenosis.  There is no evidence for neural foraminal compromise. 

When allowance is made for mild patient motion, the cervical spinal cord appears normal in caliber without evidence for localized myelomalacia.
IMPRESSION: 1. Early cervical degenerative disc disease and spondylotic change as described. 

2. No evidence for a focal nuclear herniation nor spinal canal/neural foraminal compromise.  

3. Please see in-depth discussion in body of report.

## 2023-01-11 IMAGING — MR MRI THORACIC SPINE WITHOUT CONTRAST
6 series · 48 of 48 positions shown · non-contrast
Comparison: none

﻿

MAGNETIC RESONANCE IMAGING OF THE THORACIC SPINE WITHOUT INTRAVENOUS CONTRAST ADMINISTRATION
HISTORY: Thoracic back pain.  Claustrophobia.
TECHNIQUE: Multiplanar magnetic resonance imaging of the thoracic spine was accomplished employing a surface coil and an open 0.3Tarno Kiki [place] scanner without intravenous contrast administration.  T1 and T2-weighted thin slice sections were obtained.

[Series 1: cor scano · coronal · 7.0mm · 1.56mm/px · 8 of 12 slices shown]
[im 1/12]
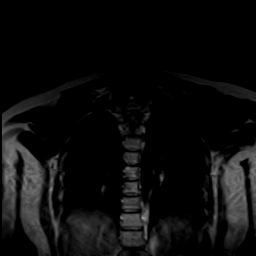
[im 2/12]
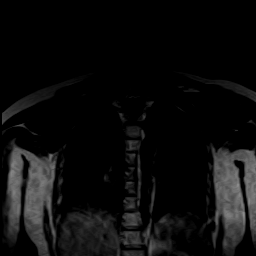
[im 4/12]
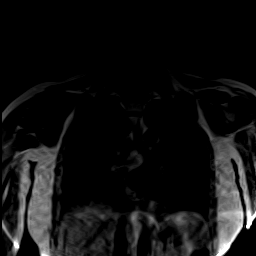
[im 5/12]
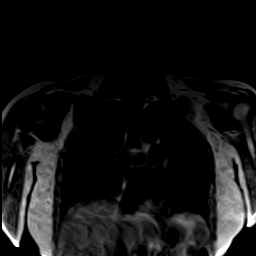
[im 7/12]
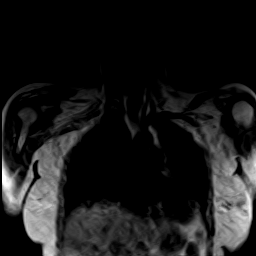
[im 8/12]
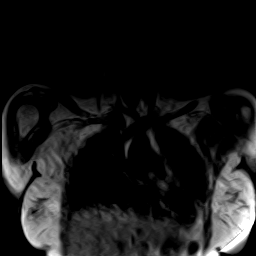
[im 10/12]
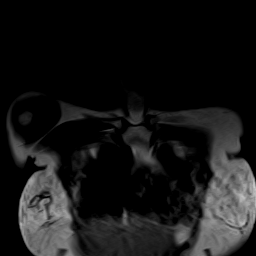
[im 12/12]
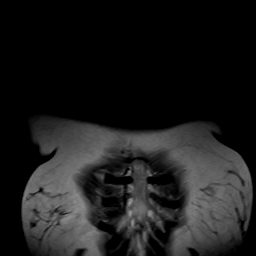

[Series 3: cor scano large · coronal · 8.0mm · 1.64mm/px · 8 of 12 slices shown]
[im 1/12]
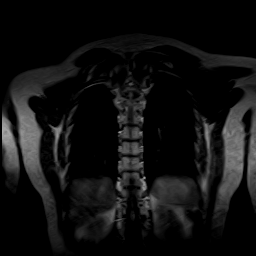
[im 2/12]
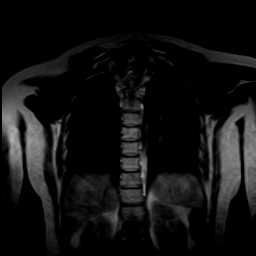
[im 4/12]
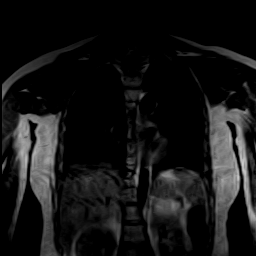
[im 5/12]
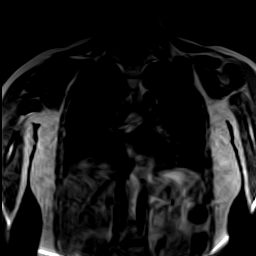
[im 7/12]
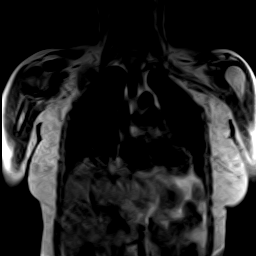
[im 8/12]
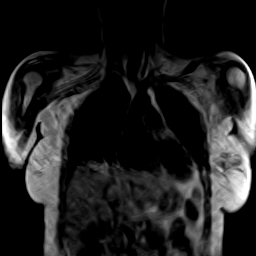
[im 10/12]
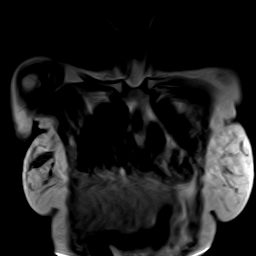
[im 12/12]
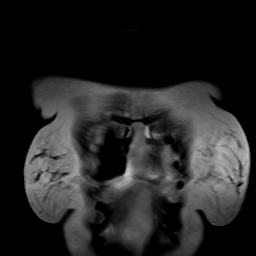

[Series 5: T2 · sagittal · 3.5mm · 1.56mm/px · 8 of 11 slices shown (1 of 2)]
[im 1/11]
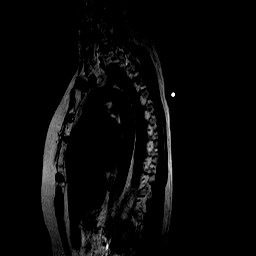
[im 2/11]
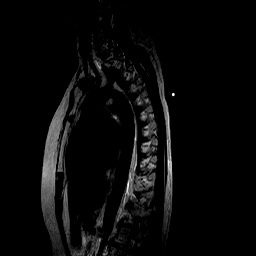
[im 3/11]
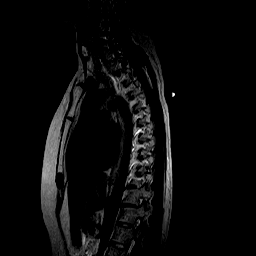
[im 5/11]
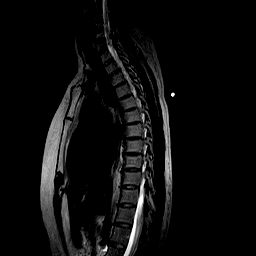
[im 6/11]
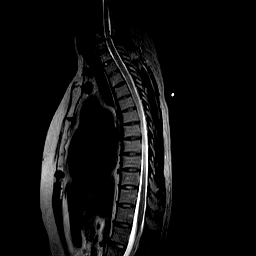
[im 8/11]
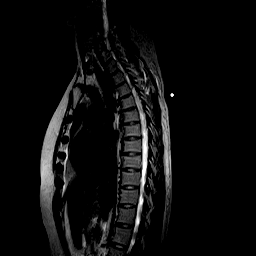
[im 9/11]
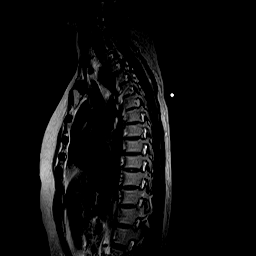
[im 11/11]
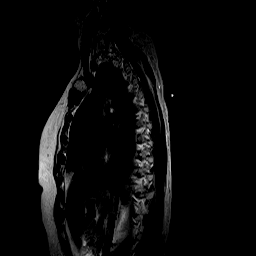

[Series 6: T1 · sagittal · 3.5mm · 1.56mm/px · 8 of 11 slices shown]
[im 1/11]
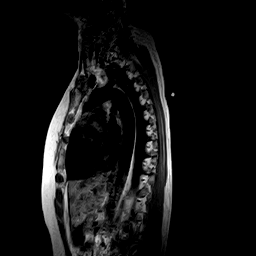
[im 2/11]
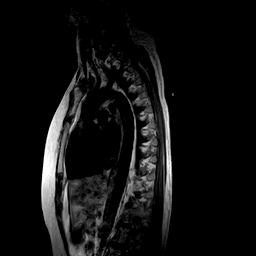
[im 3/11]
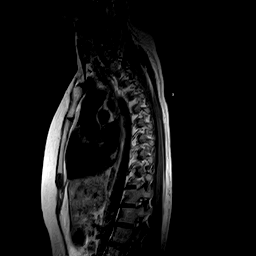
[im 5/11]
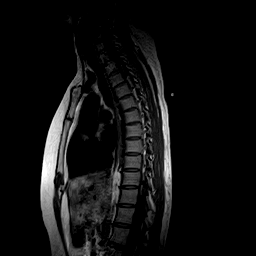
[im 6/11]
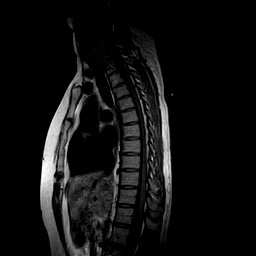
[im 8/11]
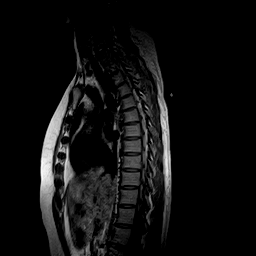
[im 9/11]
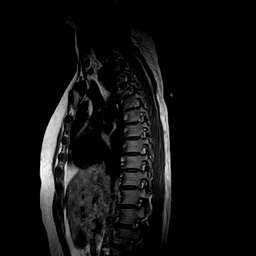
[im 11/11]
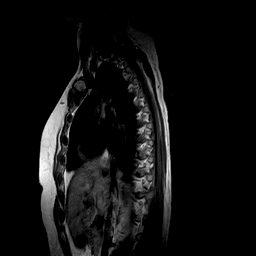

[Series 7: T2 · axial · 6.5mm · 1.48mm/px · z∈[-125,+23]mm · 8 of 12 slices shown (2 of 2)]
[im 1/12]
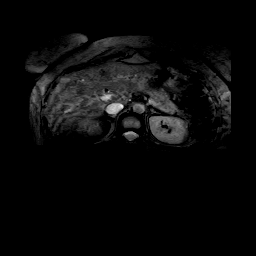
[im 2/12]
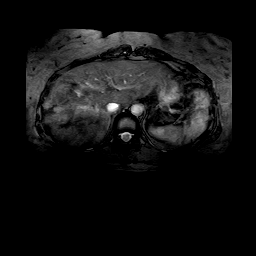
[im 4/12]
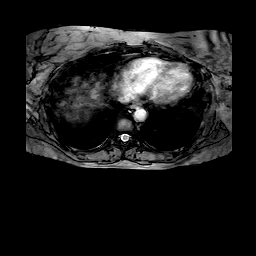
[im 5/12]
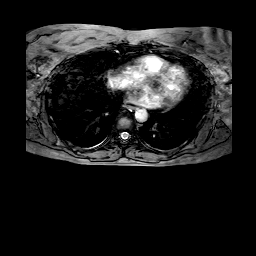
[im 7/12]
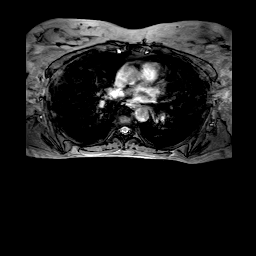
[im 8/12]
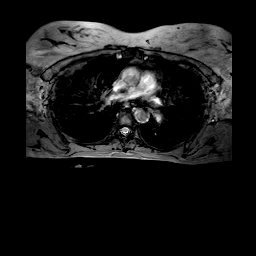
[im 10/12]
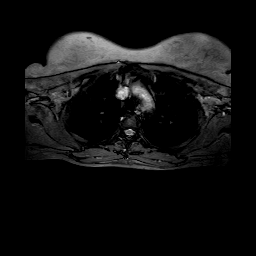
[im 12/12]
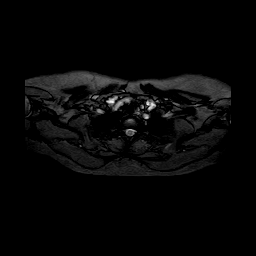

[Series 8: sag fir · sagittal · 4.0mm · 1.56mm/px · 8 of 11 slices shown]
[im 1/11]
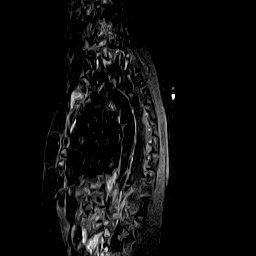
[im 2/11]
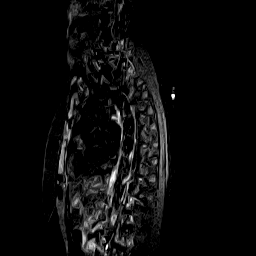
[im 3/11]
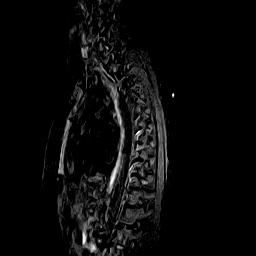
[im 5/11]
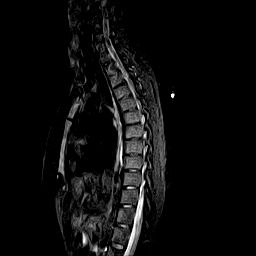
[im 6/11]
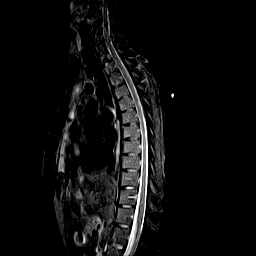
[im 8/11]
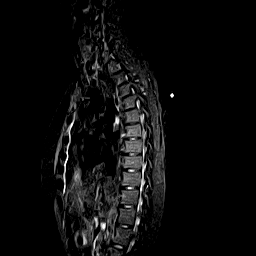
[im 9/11]
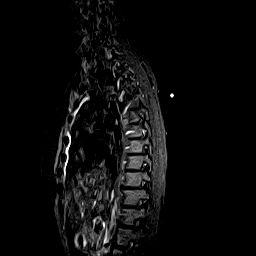
[im 11/11]
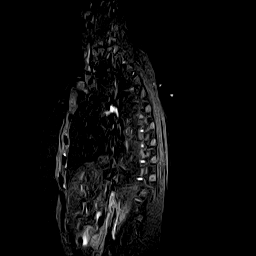

[48 of 48 positions shown; findings below may reference images not displayed]

FINDINGS: No prior studies are available for comparison purposes.  

A mild thoracic dextroscoliosis is present.  Anterior to posterior vertebral alignment maintained.  The thoracic vertebrae appear normal in height.  There are no significant abnormalities of regional bone marrow signal. 

Early thoracic degenerative disc disease with minor disc desiccation.  There is no evidence for a focal disc herniation or extrusion.  No evidence for central nor lateral thoracic spinal canal stenosis.  No evidence for neural foraminal compromise. 

When allowance is made for mild patient motion, the thoracic spinal cord appears normal in caliber without clear evidence for localized myelomalacia.
IMPRESSION: 1. Mild dextroscoliosis. 

2. Early thoracic degenerative disc disease.

3. No evidence for a focal nuclear herniation nor spinal canal/neural foraminal compromise. 

4. Please see in-depth discussion in body of report.
